# Patient Record
Sex: Male | Born: 1970 | Race: White | Hispanic: No | Marital: Married | Smoking: Current every day smoker
Health system: Southern US, Community
[De-identification: ages and names within clinical notes are randomized; demographics above are authoritative.]

## PROBLEM LIST (undated history)

## (undated) DIAGNOSIS — I10 Essential (primary) hypertension: Secondary | ICD-10-CM

## (undated) DIAGNOSIS — F988 Other specified behavioral and emotional disorders with onset usually occurring in childhood and adolescence: Secondary | ICD-10-CM

## (undated) HISTORY — DX: Other specified behavioral and emotional disorders with onset usually occurring in childhood and adolescence: F98.8

---

## 2019-11-28 DIAGNOSIS — Z0000 Encounter for general adult medical examination without abnormal findings: Secondary | ICD-10-CM | POA: Diagnosis not present

## 2019-11-28 DIAGNOSIS — G43109 Migraine with aura, not intractable, without status migrainosus: Secondary | ICD-10-CM | POA: Diagnosis not present

## 2019-11-28 DIAGNOSIS — I10 Essential (primary) hypertension: Secondary | ICD-10-CM | POA: Diagnosis not present

## 2019-11-28 DIAGNOSIS — F900 Attention-deficit hyperactivity disorder, predominantly inattentive type: Secondary | ICD-10-CM | POA: Diagnosis not present

## 2020-04-19 DIAGNOSIS — Z20822 Contact with and (suspected) exposure to covid-19: Secondary | ICD-10-CM | POA: Diagnosis not present

## 2020-05-31 DIAGNOSIS — I10 Essential (primary) hypertension: Secondary | ICD-10-CM | POA: Diagnosis not present

## 2020-05-31 DIAGNOSIS — F900 Attention-deficit hyperactivity disorder, predominantly inattentive type: Secondary | ICD-10-CM | POA: Diagnosis not present

## 2020-06-28 DIAGNOSIS — F900 Attention-deficit hyperactivity disorder, predominantly inattentive type: Secondary | ICD-10-CM | POA: Diagnosis not present

## 2020-06-28 DIAGNOSIS — I10 Essential (primary) hypertension: Secondary | ICD-10-CM | POA: Diagnosis not present

## 2020-07-07 DIAGNOSIS — I10 Essential (primary) hypertension: Secondary | ICD-10-CM | POA: Diagnosis not present

## 2020-10-31 DIAGNOSIS — Z20822 Contact with and (suspected) exposure to covid-19: Secondary | ICD-10-CM | POA: Diagnosis not present

## 2020-11-02 DIAGNOSIS — Z20822 Contact with and (suspected) exposure to covid-19: Secondary | ICD-10-CM | POA: Diagnosis not present

## 2020-11-12 DIAGNOSIS — R059 Cough, unspecified: Secondary | ICD-10-CM | POA: Diagnosis not present

## 2020-11-12 DIAGNOSIS — J019 Acute sinusitis, unspecified: Secondary | ICD-10-CM | POA: Diagnosis not present

## 2020-11-12 DIAGNOSIS — U071 COVID-19: Secondary | ICD-10-CM | POA: Diagnosis not present

## 2020-11-12 DIAGNOSIS — J40 Bronchitis, not specified as acute or chronic: Secondary | ICD-10-CM | POA: Diagnosis not present

## 2020-12-28 DIAGNOSIS — I10 Essential (primary) hypertension: Secondary | ICD-10-CM | POA: Diagnosis not present

## 2020-12-28 DIAGNOSIS — F900 Attention-deficit hyperactivity disorder, predominantly inattentive type: Secondary | ICD-10-CM | POA: Diagnosis not present

## 2021-02-09 DIAGNOSIS — I10 Essential (primary) hypertension: Secondary | ICD-10-CM | POA: Diagnosis not present

## 2021-02-09 DIAGNOSIS — Z9189 Other specified personal risk factors, not elsewhere classified: Secondary | ICD-10-CM | POA: Diagnosis not present

## 2021-02-09 DIAGNOSIS — Z720 Tobacco use: Secondary | ICD-10-CM | POA: Diagnosis not present

## 2021-02-16 ENCOUNTER — Emergency Department (HOSPITAL_COMMUNITY)
Admission: EM | Admit: 2021-02-16 | Discharge: 2021-02-17 | Disposition: A | Discharge status: Home / Self Care | Payer: BC Managed Care – PPO | Attending: Emergency Medicine | Admitting: Emergency Medicine

## 2021-02-16 ENCOUNTER — Encounter (HOSPITAL_COMMUNITY): Payer: Self-pay | Admitting: *Deleted

## 2021-02-16 ENCOUNTER — Emergency Department (HOSPITAL_COMMUNITY): Payer: BC Managed Care – PPO

## 2021-02-16 ENCOUNTER — Other Ambulatory Visit: Payer: Self-pay

## 2021-02-16 DIAGNOSIS — I10 Essential (primary) hypertension: Secondary | ICD-10-CM | POA: Insufficient documentation

## 2021-02-16 DIAGNOSIS — R0789 Other chest pain: Secondary | ICD-10-CM

## 2021-02-16 DIAGNOSIS — R079 Chest pain, unspecified: Secondary | ICD-10-CM | POA: Diagnosis not present

## 2021-02-16 DIAGNOSIS — Z79899 Other long term (current) drug therapy: Secondary | ICD-10-CM | POA: Diagnosis not present

## 2021-02-16 DIAGNOSIS — R0602 Shortness of breath: Secondary | ICD-10-CM | POA: Insufficient documentation

## 2021-02-16 DIAGNOSIS — F17210 Nicotine dependence, cigarettes, uncomplicated: Secondary | ICD-10-CM | POA: Insufficient documentation

## 2021-02-16 DIAGNOSIS — Z7982 Long term (current) use of aspirin: Secondary | ICD-10-CM | POA: Insufficient documentation

## 2021-02-16 HISTORY — DX: Essential (primary) hypertension: I10

## 2021-02-16 LAB — BASIC METABOLIC PANEL
Anion gap: 9 (ref 5–15)
BUN: 5 mg/dL — ABNORMAL LOW (ref 6–20)
CO2: 26 mmol/L (ref 22–32)
Calcium: 9 mg/dL (ref 8.9–10.3)
Chloride: 93 mmol/L — ABNORMAL LOW (ref 98–111)
Creatinine, Ser: 0.86 mg/dL (ref 0.61–1.24)
GFR, Estimated: >60 mL/min (ref >60)
Glucose, Bld: 94 mg/dL (ref 70–99)
Potassium: 3.6 mmol/L (ref 3.5–5.1)
Sodium: 128 mmol/L — ABNORMAL LOW (ref 135–145)

## 2021-02-16 LAB — CBC
HCT: 43.3 % (ref 39.0–52.0)
Hemoglobin: 15.2 g/dL (ref 13.0–17.0)
MCH: 32.1 pg (ref 26.0–34.0)
MCHC: 35.1 g/dL (ref 30.0–36.0)
MCV: 91.4 fL (ref 80.0–100.0)
Platelets: 246 10*3/uL (ref 150–400)
RBC: 4.74 MIL/uL (ref 4.22–5.81)
RDW: 12.2 % (ref 11.5–15.5)
WBC: 6.3 10*3/uL (ref 4.0–10.5)
nRBC: 0 % (ref 0.0–0.2)

## 2021-02-16 LAB — TROPONIN I (HIGH SENSITIVITY)
Troponin I (High Sensitivity): 15 ng/L (ref <18)
Troponin I (High Sensitivity): 13 ng/L (ref <18)

## 2021-02-16 LAB — BRAIN NATRIURETIC PEPTIDE: B Natriuretic Peptide: 50.1 pg/mL (ref 0.0–100.0)

## 2021-02-16 MED ORDER — NITROGLYCERIN 0.4 MG SL SUBL: 0.4000 mg | SUBLINGUAL_TABLET | SUBLINGUAL | 0 refills | Status: AC | PRN

## 2021-02-16 NOTE — ED Triage Notes (Signed)
Pt from home c./o hypertension, sob and R sided cp for a few days. Pt's bp readings 160s/100s. Woke up this morning with numbness and nausea. Reports compliance with bp meds.

## 2021-02-16 NOTE — ED Provider Notes (Signed)
[facility] EMERGENCY DEPARTMENT Provider Note   CSN: [id number] Arrival date & time: 02/16/21  1914     History Chief Complaint  Patient presents with   Hypertension   Chest Pain    Marvin Martinez is a 50 y.o. male.  HPI Patient is a 50 year old male with a history of hypertension who presents to the emergency department due to hypertension.  States that he began checking his blood pressure 2 weeks ago and noticed that it was fluctuating between the 160s to the 170s.  He smokes 1.5 packs/day and has been experiencing more shortness of breath per normal and also notes intermittent chest pressure.  States the pressure is along the central/left chest.  No modifying factors.  Also notes intermittent pain in the right arm that radiates into the right hand with tingling.  States that he followed up with his PCP regarding his blood pressure and his diltiazem was switched to extended release.  He is currently taking 50 mg of losartan daily as well as 120 mg of diltiazem XR daily.  Feels that his symptoms have not improved.  Earlier today he had 2-3 episodes of lightheadedness as well as an episode of nausea.  No syncope or vomiting.  He followed back up with his PCP and has an appointment tomorrow morning with cardiology.  Patient drinks about 3 alcoholic beverages per night.    Past Medical History:  Diagnosis Date   Hypertension     There are no problems to display for this patient.   History reviewed. No pertinent surgical history.     Family History  Problem Relation Age of Onset   COPD Mother    Congestive Heart Failure Mother    Hypertension Father     Social History   Tobacco Use   Smoking status: Every Day    Packs/day: 1.50    Years: 30.00    Pack years: 45.00    Types: Cigarettes   Smokeless tobacco: Never  Vaping Use   Vaping Use: Never used  Substance Use Topics   Alcohol use: Yes    Alcohol/week: 2.0 standard drinks    Types: 1 Glasses of  wine, 1 Cans of beer per week    Comment: daily   Drug use: Never    Home Medications Prior to Admission medications   Medication Sig Start Date End Date Taking? Authorizing Provider  nitroGLYCERIN (NITROSTAT) 0.4 MG SL tablet Place 1 tablet (0.4 mg total) under the tongue every 5 (five) minutes as needed for chest pain. 02/16/21  Yes Placido Sou, PA-C  amLODipine (NORVASC) 10 MG tablet Take 1 tablet (10 mg total) by mouth every evening. 02/17/21 05/18/21  Yates Decamp, MD  aspirin 81 MG chewable tablet Chew 81 mg by mouth daily.    [provider]  atorvastatin (LIPITOR) 10 MG tablet Take 1 tablet (10 mg total) by mouth daily. 02/17/21 05/18/21  Yates Decamp, MD  hydrochlorothiazide (MICROZIDE) 12.5 MG capsule Take 1 capsule (12.5 mg total) by mouth daily. 02/17/21 03/19/21  Yates Decamp, MD  losartan (COZAAR) 100 MG tablet Take 1 tablet (100 mg total) by mouth daily. 02/17/21   Yates Decamp, MD  nicotine polacrilex (NICORETTE) 4 MG gum Take 1 each (4 mg total) by mouth as needed for smoking cessation. 02/17/21   Yates Decamp, MD    Allergies    Patient has no known allergies.  Review of Systems   Review of Systems  All other systems reviewed and are negative.  Ten systems reviewed and are negative for acute change, except as noted in the HPI.   Physical Exam Updated Vital Signs BP (!) 156/91   Pulse 67   Temp 98 F (36.7 C) (Oral)   Resp (!) 23   Ht 5\' 8"  (1.727 m)   Wt 77.6 kg   SpO2 98%   BMI 26.00 kg/m   Physical Exam Vitals and nursing note reviewed.  Constitutional:      General: He is not in acute distress.    Appearance: Normal appearance. He is well-developed. He is not ill-appearing, toxic-appearing or diaphoretic.  HENT:     Head: Normocephalic and atraumatic.     Right Ear: External ear normal.     Left Ear: External ear normal.     Nose: Nose normal.     Mouth/Throat:     Mouth: Mucous membranes are moist.     Pharynx: Oropharynx is clear. No oropharyngeal  exudate or posterior oropharyngeal erythema.  Eyes:     Extraocular Movements: Extraocular movements intact.  Cardiovascular:     Rate and Rhythm: Normal rate and regular rhythm.     Pulses: Normal pulses.          Radial pulses are 2+ on the right side and 2+ on the left side.       Dorsalis pedis pulses are 2+ on the right side and 2+ on the left side.     Heart sounds: Normal heart sounds. Heart sounds not distant. No murmur heard. No systolic murmur is present.  No diastolic murmur is present.    No friction rub. No gallop. No S3 or S4 sounds.  Pulmonary:     Effort: Pulmonary effort is normal. No tachypnea, accessory muscle usage or respiratory distress.     Breath sounds: Normal breath sounds. No stridor. No decreased breath sounds, wheezing, rhonchi or rales.  Abdominal:     General: Abdomen is flat.     Tenderness: There is no abdominal tenderness.  Musculoskeletal:        General: Normal range of motion.     Cervical back: Normal range of motion and neck supple. No tenderness.     Right lower leg: No tenderness. No edema.     Left lower leg: No tenderness. No edema.  Skin:    General: Skin is warm and dry.  Neurological:     General: No focal deficit present.     Mental Status: He is alert and oriented to person, place, and time.  Psychiatric:        Mood and Affect: Mood normal.        Behavior: Behavior normal.   ED Results / Procedures / Treatments   Labs (all labs ordered are listed, but only abnormal results are displayed) Labs Reviewed  BASIC METABOLIC PANEL - Abnormal; Notable for the following components:      Result Value   Sodium 128 (*)    Chloride 93 (*)    BUN 5 (*)    All other components within normal limits  CBC  BRAIN NATRIURETIC PEPTIDE  TROPONIN I (HIGH SENSITIVITY)  TROPONIN I (HIGH SENSITIVITY)    EKG EKG Interpretation  Date/Time:  Wednesday February 16 2021 19:48:04 EDT Ventricular Rate:  71 PR Interval:  166 QRS Duration: 100 QT  Interval:  410 QTC Calculation: 445 R Axis:   67 Text Interpretation: Normal sinus rhythm Left ventricular hypertrophy with repolarization abnormality ( Sokolow-Lyon ) Abnormal ECG Motion artifact V3, no STEMI Confirmed by  Alvester Chou ([id number]) on 02/16/2021 8:19:46 PM  Radiology DG Chest 2 View  Result Date: 02/16/2021 CLINICAL DATA:  Chest pain short of breath EXAM: CHEST - 2 VIEW COMPARISON:  11/12/2020 FINDINGS: The heart size and mediastinal contours are within normal limits. Both lungs are clear. The visualized skeletal structures are unremarkable. IMPRESSION: No active cardiopulmonary disease. Electronically Signed   By: Marlan Palau M.D.   On: 02/16/2021 21:03    Procedures Procedures   Medications Ordered in ED Medications - No data to display  ED Course  I have reviewed the triage vital signs and the nursing notes.  Pertinent labs & imaging results that were available during my care of the patient were reviewed by me and considered in my medical decision making (see chart for details).  Clinical Course as of 02/17/21 1405  Wed Feb 16, 2021  2000 50 yo male [MT]  2254 Patient discussed with Dr. Jacinto Halim.  Given patient's troponin being 15 and his ECG showing no ischemia and also patient currently having no chest pain, recommends second troponin as well as a BNP.  If negative, patient can follow-up outpatient in his office tomorrow morning. [LJ]  2325 Sodium(!): 128 Pt notes drinking about 3 alcoholic beverages per night. Likely the source of his sodium levels. [LJ]    Clinical Course User Index [LJ] Placido Sou, PA-C [MT] Renaye Rakers Kermit Balo, MD   MDM Rules/Calculators/A&P                          Pt is a 50 y.o. male who presents to the ED d/t HTN, CP, SOB, lightheadedness, nausea.   Labs: CBC without abnormalities. BMP with a sodium of 128, chloride of 93 and a BUN of 5. Troponin of 15 with a repeat of 13. BNP of 50.1  Imaging: CXR is negative.  I, Placido Sou, PA-C, personally reviewed and evaluated these images and lab results as part of my medical decision-making.  Patient notes having an appointment tomorrow with Dr. Jacinto Halim with cardiology. Patient discussed with Dr. Jacinto Halim who recommends repeat troponin and BNP, and if reassuring, discharge home. Labs resulted and troponins appear flat. BNP WNL at 50.1. No ischemic changes noted on ECG. Doubt ACS at this time. Will discharge on a course of NTG for use as needed.   Patient also incidentally found to by hyponatremic at 128. He notes regular ETOH use and states he drinks about 3 ETOH beverages per night. 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   Feel that patient is stable for d/c at this time and he is agreeable. Discussed return precautions in length. Urged patient to make sure he follows up with cardiology tomorrow morning at his scheduled appointment. His questions were answered and he was amicable at the time of discharge.  Patient discussed with an evaluated by my attending physician Dr. Alvester Chou who is in agreement with the above plan.   Note: Portions of this report may have been transcribed using voice recognition software. Every effort was made to ensure accuracy; however, inadvertent computerized transcription errors may be present.   Final Clinical Impression(s) / ED Diagnoses Final diagnoses:  Chest pressure  Hypertension, unspecified type   Rx / DC Orders ED Discharge Orders          Ordered    nitroGLYCERIN (NITROSTAT) 0.4 MG SL tablet  Every 5 min PRN        02/16/21 2352  Placido Sou, PA-C 02/17/21 1411    Terald Sleeper, MD 02/17/21 1510

## 2021-02-16 NOTE — Discharge Instructions (Addendum)
I am prescribing nitroglycerin.  If you develop worsening chest tightness and shortness of breath please take this as prescribed and call [phone].  Please follow-up with Dr. Jacinto Halim at your appointment tomorrow morning.  Please come back to the emergency department with any new or worsening symptoms.

## 2021-02-17 ENCOUNTER — Encounter: Payer: Self-pay | Admitting: Cardiology

## 2021-02-17 ENCOUNTER — Ambulatory Visit: Payer: BC Managed Care – PPO | Admitting: Cardiology

## 2021-02-17 VITALS — BP 140/96 | HR 70 | Temp 97.6°F | Resp 16 | Ht 68.0 in | Wt 173.8 lb

## 2021-02-17 DIAGNOSIS — F17200 Nicotine dependence, unspecified, uncomplicated: Secondary | ICD-10-CM

## 2021-02-17 DIAGNOSIS — R0602 Shortness of breath: Secondary | ICD-10-CM | POA: Diagnosis not present

## 2021-02-17 DIAGNOSIS — F17210 Nicotine dependence, cigarettes, uncomplicated: Secondary | ICD-10-CM | POA: Diagnosis not present

## 2021-02-17 DIAGNOSIS — R072 Precordial pain: Secondary | ICD-10-CM

## 2021-02-17 DIAGNOSIS — I10 Essential (primary) hypertension: Secondary | ICD-10-CM

## 2021-02-17 MED ORDER — ATORVASTATIN CALCIUM 10 MG PO TABS: 10.0000 mg | ORAL_TABLET | Freq: Every day | ORAL | 2 refills | Status: DC

## 2021-02-17 MED ORDER — HYDROCHLOROTHIAZIDE 12.5 MG PO CAPS: 12.5000 mg | ORAL_CAPSULE | Freq: Every day | ORAL | 0 refills | Status: DC

## 2021-02-17 MED ORDER — NICOTINE POLACRILEX 4 MG MT GUM: 4.0000 mg | CHEWING_GUM | OROMUCOSAL | 0 refills | Status: DC | PRN

## 2021-02-17 MED ORDER — AMLODIPINE BESYLATE 10 MG PO TABS: 10.0000 mg | ORAL_TABLET | Freq: Every evening | ORAL | 2 refills | Status: DC

## 2021-02-17 NOTE — ED Notes (Signed)
Pt discharged and ambulated out of the ED without difficulty. 

## 2021-02-17 NOTE — ED Provider Notes (Signed)
50 yo male w/ hx of HTN, smoking (1.5 ppd) presenting to ED with chest pressure, fatigue.  Sx ongoing for 2-3 weeks but worsening today.  He reports intermittent chest pressures, which often occur at rest.  Sometimes associated with right arm heaviness.  Occasional nausea and lightheadedness.  BP has been higher than normal past 2 weeks.  Today he presented to ED because he feels very fatigued.  No active CP or discomfort at this time.  Wife is present at bedside.  Patient has office appointment with Dr Jacinto Halim, cardiologist, tomorrow morning (first appointment).  He denies personal or sig family hx of MI or cardiac disease.  Denies hx of diabetes or HLD.  Workup today showing NSR without evident ischemia on his ECG.  Delta troponins negative, flat.  BNP unremarkable.  Xray chest without focal infiltrates.  Pt reported having covid in July, 4 months ago, and does not want retesting.  The duration of his symptoms (2-3 weeks) seems less consistent with influenza.  No fever today.  Case was discussed with Dr Jacinto Halim, who advised patient could follow up with him in the office tomorrow.  Patient remained free of chest or arm discomfort in the ED, with normal blood tests.  I explained to him and his wife that I do have significant concerns for coronary disease with his presentation. They would prefer not to stay in the hospital tonight if they can be seen in the office tomorrow.  I think this is reasonable, given how close this follow up is.  HEART score of 4, moderate risk.  We can provide SL nitro and advised to use if chest discomfort returns; close return precautions provided.  They both understand that his negative workup in the ED today does NOT exclude significant coronary disease.   Terald Sleeper, MD 02/17/21 1014

## 2021-02-17 NOTE — Progress Notes (Signed)
Primary Physician/Referring:  Aliene BeamsHagler, Rachel, MD  Patient ID: Marvin Martinez, male    DOB: 08/25/1970, 50 y.o.   MRN: [id number]  Chief Complaint  Patient presents with   Hypertension   Shortness of Breath   Nicotine Dependence   New Patient (Initial Visit)    Referred by Aliene Beamsachel Hagler, MD   HPI:    Marvin Martinez  is a 50 y.o. Caucasian male patient with hypertension, referred to me for evaluation of dyspnea on exertion.  He has longstanding history of tobacco use disorder since age 50.  He was seen in the emergency room yesterday with chest pain and also left arm pain and associated with dyspnea on exertion.  He had abnormal EKG with ST depression and LVH but was ruled out for myocardial infarction and heart failure by serial enzymes and chemistry and chest x-ray, discharged home, he had an appointment to see me this morning.  Over the past 3 weeks he has noticed right shoulder and right arm discomfort after he tried to start an engine by pulling on the cord, since then he has been having chest tightness on and off that last a few minutes.  He also has left-sided chest tightness.  On further questioning he has been ongoing symptoms for several months when he climbs 1 flight of stairs with chest tightness.  Dyspnea on exertion has gradually worsened over time he is still smoking about 1.5 packs of cigarettes a day.  He also states that for the past 1 year or so, his blood pressure has been very much uncontrolled.  Past Medical History:  Diagnosis Date   Hypertension    History reviewed. No pertinent surgical history. Family History  Problem Relation Age of Onset   COPD Mother    Congestive Heart Failure Mother    Hypertension Father     Social History   Tobacco Use   Smoking status: Every Day    Packs/day: 1.50    Years: 30.00    Pack years: 45.00    Types: Cigarettes   Smokeless tobacco: Never  Substance Use Topics   Alcohol use: Yes    Alcohol/week: 2.0 standard drinks     Types: 1 Glasses of wine, 1 Cans of beer per week    Comment: daily   Marital Status: Married  ROS  Review of Systems  Cardiovascular:  Positive for chest pain and dyspnea on exertion. Negative for leg swelling.  Gastrointestinal:  Negative for melena.  Objective  Blood pressure (!) 140/96, pulse 70, temperature 97.6 F (36.4 C), temperature source Temporal, resp. rate 16, height 5\' 8"  (1.727 m), weight 173 lb 12.8 oz (78.8 kg), SpO2 100 %. Body mass index is 26.43 kg/m.  Vitals with BMI 02/17/2021 02/17/2021 02/16/2021  Height - 5\' 8"  -  Weight - 173 lbs 13 oz -  BMI - 26.43 -  Systolic 140 154 [id number]  Diastolic 96 96 91  Pulse 70 69 67     Physical Exam Neck:     Vascular: No carotid bruit or JVD.  Cardiovascular:     Rate and Rhythm: Normal rate and regular rhythm.     Pulses: Intact distal pulses.     Heart sounds: Normal heart sounds. No murmur heard.   No gallop.  Pulmonary:     Effort: Pulmonary effort is normal.     Breath sounds: Normal breath sounds.  Abdominal:     General: Bowel sounds are normal.     Palpations: Abdomen is  soft.  Musculoskeletal:        General: No swelling.     Laboratory examination:   Recent Labs    02/16/21 2000  NA 128*  K 3.6  CL 93*  CO2 26  GLUCOSE 94  BUN 5*  CREATININE 0.86  CALCIUM 9.0  GFRNONAA >60   estimated creatinine clearance is 100.5 mL/min (by C-G formula based on SCr of 0.86 mg/dL).  CMP Latest Ref Rng & Units 02/16/2021  Glucose 70 - 99 mg/dL 94  BUN 6 - 20 mg/dL 5(L)  Creatinine 0.61 - 1.24 mg/dL 0.86  Sodium 135 - 145 mmol/L 128(L)  Potassium 3.5 - 5.1 mmol/L 3.6  Chloride 98 - 111 mmol/L 93(L)  CO2 22 - 32 mmol/L 26  Calcium 8.9 - 10.3 mg/dL 9.0   CBC Latest Ref Rng & Units 02/16/2021  WBC 4.0 - 10.5 K/uL 6.3  Hemoglobin 13.0 - 17.0 g/dL 15.2  Hematocrit 39.0 - 52.0 % 43.3  Platelets 150 - 400 K/uL 246    Lipid Panel No results for input(s): CHOL, TRIG, LDLCALC, VLDL, HDL, CHOLHDL, LDLDIRECT in the  last 8760 hours. Lipid Panel  No results found for: CHOL, TRIG, HDL, CHOLHDL, VLDL, LDLCALC, LDLDIRECT, LABVLDL   HEMOGLOBIN A1C No results found for: HGBA1C, MPG TSH No results for input(s): TSH in the last 8760 hours.  Cardiac Panel (last 3 results) Recent Labs    02/16/21 2000 02/16/21 2158  TROPONINIHS 15 13    BNP (last 3 results) Recent Labs    02/16/21 2000  BNP 50.1    ProBNP (last 3 results) No results for input(s): PROBNP in the last 8760 hours.  External labs:   Labs 07/07/2020:  Total cholesterol 174, triglycerides 44, HDL 78, LDL 86.  Non-HDL cholesterol 96.  Medications and allergies  No Known Allergies   Medication prior to this encounter:   Outpatient Medications Prior to Visit  Medication Sig Dispense Refill   aspirin 81 MG chewable tablet Chew 81 mg by mouth daily.     losartan (COZAAR) 100 MG tablet Take 1 tablet (100 mg total) by mouth daily. 60 tablet 0   nitroGLYCERIN (NITROSTAT) 0.4 MG SL tablet Place 1 tablet (0.4 mg total) under the tongue every 5 (five) minutes as needed for chest pain. 12 tablet 0   diltiazem (CARDIZEM) 120 MG tablet Take 120 mg by mouth 2 (two) times daily.     losartan (COZAAR) 50 MG tablet Take 50 mg by mouth daily.     No facility-administered medications prior to visit.     Medication list after today's encounter   Current Outpatient Medications  Medication Instructions   amLODipine (NORVASC) 10 mg, Oral, Every evening   aspirin 81 mg, Oral, Daily   atorvastatin (LIPITOR) 10 mg, Oral, Daily   hydrochlorothiazide (MICROZIDE) 12.5 mg, Oral, Daily   losartan (COZAAR) 100 mg, Oral, Daily   nicotine polacrilex (NICORETTE) 4 mg, Oral, As needed   nitroGLYCERIN (NITROSTAT) 0.4 mg, Sublingual, Every 5 min PRN    Radiology:   DG Chest 2 View  Result Date: 02/16/2021 CLINICAL DATA:  Chest pain short of breath EXAM: CHEST - 2 VIEW COMPARISON:  11/12/2020 FINDINGS: The heart size and mediastinal contours are within  normal limits. Both lungs are clear. The visualized skeletal structures are unremarkable. IMPRESSION: No active cardiopulmonary disease. Electronically Signed   By: Franchot Gallo M.D.   On: 02/16/2021 21:03   .  Compared to 12/04/2020, mild cardiomegaly not present.  Cardiac Studies:  NA  EKG:   EKG 02/17/2021: Normal sinus rhythm at rate of 63 bpm, normal axis, nonspecific ST depression in the lateral leads, LVH with repolarization abnormality.  No change from yesterday's EKG on 02/16/2021.    Assessment     ICD-10-CM   1. Primary hypertension  I10 EKG 12-Lead    losartan (COZAAR) 100 MG tablet    hydrochlorothiazide (MICROZIDE) 12.5 MG capsule    amLODipine (NORVASC) 10 MG tablet    Basic metabolic panel    2. SOB (shortness of breath)  R06.02     3. Precordial pain  R07.2 PCV MYOCARDIAL PERFUSION WO LEXISCAN    PCV ECHOCARDIOGRAM COMPLETE    CT CARDIAC SCORING ([facility] LOCATIONS ONLY)    amLODipine (NORVASC) 10 MG tablet    atorvastatin (LIPITOR) 10 MG tablet    4. Tobacco use disorder, severe, dependence  F17.200 nicotine polacrilex (NICORETTE) 4 MG gum       Medications Discontinued During This Encounter  Medication Reason   losartan (COZAAR) 50 MG tablet Dose change   diltiazem (CARDIZEM) 120 MG tablet Change in therapy    Meds ordered this encounter  Medications   hydrochlorothiazide (MICROZIDE) 12.5 MG capsule    Sig: Take 1 capsule (12.5 mg total) by mouth daily.    Dispense:  30 capsule    Refill:  0   amLODipine (NORVASC) 10 MG tablet    Sig: Take 1 tablet (10 mg total) by mouth every evening.    Dispense:  30 tablet    Refill:  2    Discontinue diltiazem   nicotine polacrilex (NICORETTE) 4 MG gum    Sig: Take 1 each (4 mg total) by mouth as needed for smoking cessation.    Dispense:  100 tablet    Refill:  0   atorvastatin (LIPITOR) 10 MG tablet    Sig: Take 1 tablet (10 mg total) by mouth daily.    Dispense:  30 tablet    Refill:  2    Orders Placed  This Encounter  Procedures   CT CARDIAC SCORING ([facility] LOCATIONS ONLY)    Standing Status:   Future    Standing Expiration Date:   04/19/2021    Order Specific Question:   Preferred imaging location?    Answer:   [facility]   Basic metabolic panel    Standing Status:   Future    Standing Expiration Date:   02/17/2022   PCV MYOCARDIAL PERFUSION WO LEXISCAN    Standing Status:   Future    Standing Expiration Date:   04/19/2021   EKG 12-Lead   PCV ECHOCARDIOGRAM COMPLETE    Standing Status:   Future    Standing Expiration Date:   02/17/2022   Recommendations:   Marvin Martinez is a 50 y.o. Caucasian male patient with hypertension, referred to me for evaluation of dyspnea on exertion.  He has longstanding history of tobacco use disorder since age 82.  He was seen in the emergency room yesterday with chest pain and also left arm pain and associated with dyspnea on exertion.  He had abnormal EKG with ST depression and LVH but was ruled out for myocardial infarction and heart failure by serial enzymes and chemistry and chest x-ray, discharged home, he had an appointment to see me this morning.  Over the past 3 weeks he has noticed right shoulder and right arm discomfort after he tried to start an engine by pulling on the cord, since then he has been having chest  tightness on and off that last a few minutes.  He also has left-sided chest tightness.  On further questioning he has been ongoing symptoms for several months when he climbs 1 flight of stairs with chest tightness.  Dyspnea on exertion has gradually worsened over time he is still smoking about 1.5 packs of cigarettes a day.  Story is concerning for angina pectoris.  His blood pressure still remains elevated.  I will discontinue diltiazem as his heart rate is relatively low and I plan to perform stress test, will change him to amlodipine 10 mg daily.  We will also increase his losartan 100 mg daily, I will add HCTZ to this.  Eventually if blood pressure is  controlled, I will combine the medications.  He does have nitroglycerin and we discussed regarding signs and symptoms of angina pectoris. Schedule for a Exercise Nuclear stress test to evaluate for myocardial ischemia. Will schedule for an echocardiogram.  I will also schedule him for a coronary calcium score in view of >40-pack-year history of smoking.  Tobacco use cessation discussed, counseling performed for additional 6 to 7 minutes, I prescribed him Nicorette gum.  I will go ahead and start him on atorvastatin 10 mg daily for now for cardiovascular protection.  He is presently on aspirin 81 mg, continue the same.  Office visit following the work-up/investigations in 4 weeks or sooner if he still continues to have persistent symptoms.     Adrian Prows, MD, [facility] 02/17/2021, 11:32 AM Office: [phone]

## 2021-02-25 ENCOUNTER — Telehealth: Payer: Self-pay

## 2021-02-25 NOTE — Telephone Encounter (Signed)
Patient called about right arm pain and wants to know of it will affect him doing the stress and echocardiogram tests on Monday. Patient also complains of fatigue and weakness. Patient does not have MyChart would like call back.

## 2021-02-25 NOTE — Telephone Encounter (Signed)
Arm pain should not affect him from the stress test, it is not a very intense stress test.  We only use a certain protocol.  Let him get all the test done so we can figure out what exactly is happening.

## 2021-02-25 NOTE — Telephone Encounter (Signed)
Patient is aware, he will be here at scheduled time.

## 2021-02-28 ENCOUNTER — Ambulatory Visit: Payer: BC Managed Care – PPO

## 2021-02-28 ENCOUNTER — Other Ambulatory Visit: Payer: Self-pay

## 2021-02-28 DIAGNOSIS — R072 Precordial pain: Secondary | ICD-10-CM | POA: Diagnosis not present

## 2021-02-28 NOTE — Progress Notes (Signed)
Mild LVH, aortic root enlargement, normal LVEF.  We will discuss on office visit.

## 2021-03-03 ENCOUNTER — Other Ambulatory Visit: Payer: Self-pay | Admitting: Cardiology

## 2021-03-03 DIAGNOSIS — I10 Essential (primary) hypertension: Secondary | ICD-10-CM | POA: Diagnosis not present

## 2021-03-04 LAB — BASIC METABOLIC PANEL
BUN/Creatinine Ratio: 7 — ABNORMAL LOW (ref 9–20)
BUN: 6 mg/dL (ref 6–24)
CO2: 26 mmol/L (ref 20–29)
Calcium: 9.5 mg/dL (ref 8.7–10.2)
Chloride: 91 mmol/L — ABNORMAL LOW (ref 96–106)
Creatinine, Ser: 0.85 mg/dL (ref 0.76–1.27)
Glucose: 85 mg/dL (ref 70–99)
Potassium: 4.3 mmol/L (ref 3.5–5.2)
Sodium: 132 mmol/L — ABNORMAL LOW (ref 134–144)
eGFR: 107 mL/min/{1.73_m2} (ref >59)

## 2021-03-06 NOTE — Progress Notes (Signed)
Stable BMP and sodium level has improved

## 2021-03-08 ENCOUNTER — Ambulatory Visit
Admission: RE | Admit: 2021-03-08 | Discharge: 2021-03-08 | Disposition: A | Discharge status: Home / Self Care | Payer: No Typology Code available for payment source | Source: Ambulatory Visit | Attending: Cardiology | Admitting: Cardiology

## 2021-03-08 DIAGNOSIS — R072 Precordial pain: Secondary | ICD-10-CM

## 2021-03-08 NOTE — Progress Notes (Signed)
Coronary calcium score 03/08/2021: LM 0 LAD 39.5 LCx 0 RCA 3.4 Total Agatston score 42.9, MESA database percentile 83. Aneurysmal dilatation of the ascending aorta at 41 mm and descending aorta is normal at 27.  There is mild calcification at the level of the aortic valve.  Recommend correlation with echocardiography.

## 2021-03-09 ENCOUNTER — Other Ambulatory Visit: Payer: Self-pay

## 2021-03-09 ENCOUNTER — Ambulatory Visit: Payer: BC Managed Care – PPO

## 2021-03-09 DIAGNOSIS — R072 Precordial pain: Secondary | ICD-10-CM

## 2021-03-11 ENCOUNTER — Other Ambulatory Visit: Payer: Self-pay | Admitting: Cardiology

## 2021-03-11 DIAGNOSIS — I10 Essential (primary) hypertension: Secondary | ICD-10-CM

## 2021-03-11 DIAGNOSIS — R072 Precordial pain: Secondary | ICD-10-CM

## 2021-03-16 ENCOUNTER — Other Ambulatory Visit: Payer: BC Managed Care – PPO

## 2021-03-16 ENCOUNTER — Other Ambulatory Visit: Payer: Self-pay

## 2021-03-16 DIAGNOSIS — R072 Precordial pain: Secondary | ICD-10-CM | POA: Diagnosis not present

## 2021-03-17 DIAGNOSIS — R0789 Other chest pain: Secondary | ICD-10-CM | POA: Diagnosis not present

## 2021-03-17 DIAGNOSIS — I10 Essential (primary) hypertension: Secondary | ICD-10-CM | POA: Diagnosis not present

## 2021-03-17 DIAGNOSIS — F900 Attention-deficit hyperactivity disorder, predominantly inattentive type: Secondary | ICD-10-CM | POA: Diagnosis not present

## 2021-03-17 DIAGNOSIS — J439 Emphysema, unspecified: Secondary | ICD-10-CM | POA: Diagnosis not present

## 2021-03-23 ENCOUNTER — Encounter: Payer: Self-pay | Admitting: Cardiology

## 2021-03-23 ENCOUNTER — Ambulatory Visit: Payer: BC Managed Care – PPO | Admitting: Cardiology

## 2021-03-23 ENCOUNTER — Other Ambulatory Visit: Payer: Self-pay

## 2021-03-23 VITALS — BP 167/98 | HR 96 | Temp 97.7°F | Resp 17 | Ht 68.0 in | Wt 175.6 lb

## 2021-03-23 DIAGNOSIS — I7121 Aneurysm of the ascending aorta, without rupture: Secondary | ICD-10-CM | POA: Diagnosis not present

## 2021-03-23 DIAGNOSIS — R072 Precordial pain: Secondary | ICD-10-CM

## 2021-03-23 DIAGNOSIS — E7800 Pure hypercholesterolemia, unspecified: Secondary | ICD-10-CM | POA: Diagnosis not present

## 2021-03-23 DIAGNOSIS — I10 Essential (primary) hypertension: Secondary | ICD-10-CM | POA: Diagnosis not present

## 2021-03-23 DIAGNOSIS — R931 Abnormal findings on diagnostic imaging of heart and coronary circulation: Secondary | ICD-10-CM

## 2021-03-23 MED ORDER — METOPROLOL SUCCINATE ER 50 MG PO TB24: 50.0000 mg | ORAL_TABLET | Freq: Every day | ORAL | 1 refills | Status: DC

## 2021-03-23 MED ORDER — OLMESARTAN MEDOXOMIL-HCTZ 40-25 MG PO TABS: 1.0000 | ORAL_TABLET | ORAL | 1 refills | Status: DC

## 2021-03-23 MED ORDER — ATORVASTATIN CALCIUM 20 MG PO TABS: 20.0000 mg | ORAL_TABLET | Freq: Every day | ORAL | 1 refills | Status: DC

## 2021-03-23 NOTE — Progress Notes (Signed)
Primary Physician/Referring:  Caren Macadam, MD  Patient ID: Marvin Martinez, male    DOB: May 03, 1970, 50 y.o.   MRN: [id number]  Chief Complaint  Patient presents with   Hypertension   Shortness of Breath   Chest Pain    4 weeks   HPI:    Marvin Martinez  is a 50 y.o. Caucasian male patient with hypertension,  longstanding history of tobacco use disorder since age 55,  abnormal EKG with ST depression and LVH, who had seen about 6 weeks ago for exertional chest pain suggestive of angina pectoris now presents for follow-up.  I seen him 6 weeks ago, he still has chest discomfort and also dyspnea on exertion but states that since last office visit his symptoms of slightly improved.  He has not had any rest pain.  He is accompanied by his wife at the bedside.  Dyspnea has remained stable.  No PND or orthopnea.   Past Medical History:  Diagnosis Date   ADD (attention deficit disorder)    Hypertension    History reviewed. No pertinent surgical history. Family History  Problem Relation Age of Onset   COPD Mother    Congestive Heart Failure Mother    Hyperlipidemia Father    Hypertension Father    Hyperlipidemia Sister    Hypertension Sister     Social History   Tobacco Use   Smoking status: Every Day    Packs/day: 0.50    Years: 30.00    Pack years: 15.00    Types: Cigarettes   Smokeless tobacco: Never  Substance Use Topics   Alcohol use: Yes    Alcohol/week: 2.0 standard drinks    Types: 1 Glasses of wine, 1 Cans of beer per week    Comment: daily   Marital Status: Married  ROS  Review of Systems  Cardiovascular:  Positive for chest pain and dyspnea on exertion. Negative for leg swelling.  Gastrointestinal:  Negative for melena.  Objective  Blood pressure (!) 167/98, pulse 96, temperature 97.7 F (36.5 C), temperature source Temporal, resp. rate 17, height 5\' 8"  (1.727 m), weight 175 lb 9.6 oz (79.7 kg), SpO2 100 %. Body mass index is 26.7 kg/m.  Vitals with BMI  03/23/2021 02/17/2021 02/17/2021  Height 5\' 8"  - 5\' 8"   Weight 175 lbs 10 oz - 173 lbs 13 oz  BMI [id number] - [id number]  Systolic [id number]  Diastolic 98 96 96  Pulse 96 70 69     Physical Exam Neck:     Vascular: No carotid bruit or JVD.  Cardiovascular:     Rate and Rhythm: Normal rate and regular rhythm.     Pulses: Intact distal pulses.     Heart sounds: Normal heart sounds. No murmur heard.   No gallop.  Pulmonary:     Effort: Pulmonary effort is normal.     Breath sounds: Normal breath sounds.  Abdominal:     General: Bowel sounds are normal.     Palpations: Abdomen is soft.  Musculoskeletal:        General: No swelling.     Laboratory examination:   Recent Labs    02/16/21 2000 03/03/21 1255  NA 128* 132*  K 3.6 4.3  CL 93* 91*  CO2 26 26  GLUCOSE 94 85  BUN 5* 6  CREATININE 0.86 0.85  CALCIUM 9.0 9.5  GFRNONAA >60  --    estimated creatinine clearance is 101.7 mL/min (by C-G formula based on SCr of  0.85 mg/dL).  CMP Latest Ref Rng & Units 03/03/2021 02/16/2021  Glucose 70 - 99 mg/dL 85 94  BUN 6 - 24 mg/dL 6 5(L)  Creatinine 0.76 - 1.27 mg/dL 0.85 0.86  Sodium 134 - 144 mmol/L 132(L) 128(L)  Potassium 3.5 - 5.2 mmol/L 4.3 3.6  Chloride 96 - 106 mmol/L 91(L) 93(L)  CO2 20 - 29 mmol/L 26 26  Calcium 8.7 - 10.2 mg/dL 9.5 9.0   CBC Latest Ref Rng & Units 02/16/2021  WBC 4.0 - 10.5 K/uL 6.3  Hemoglobin 13.0 - 17.0 g/dL 15.2  Hematocrit 39.0 - 52.0 % 43.3  Platelets 150 - 400 K/uL 246    Lipid Panel No results for input(s): CHOL, TRIG, LDLCALC, VLDL, HDL, CHOLHDL, LDLDIRECT in the last 8760 hours. Lipid Panel  No results found for: CHOL, TRIG, HDL, CHOLHDL, VLDL, LDLCALC, LDLDIRECT, LABVLDL   HEMOGLOBIN A1C No results found for: HGBA1C, MPG TSH No results for input(s): TSH in the last 8760 hours.  Cardiac Panel (last 3 results) No results for input(s): CKTOTAL, CKMB, TROPONINIHS, RELINDX in the last 72 hours.   BNP (last 3 results) Recent Labs     02/16/21 2000  BNP 50.1    ProBNP (last 3 results) No results for input(s): PROBNP in the last 8760 hours.  External labs:   Lipid Panel w/reflex   2020-07-07    Cholesterol 174   <200  CHOL/HDL 2.2   2.0-4.0  HDLD 78   30-70  Triglyceride 44   0-199  NHDL 96   0-129  LDL Chol Calc (NIH) 86   0-99    Medications and allergies   Allergies  Allergen Reactions   Valsartan     Other reaction(s): headaches     Medication prior to this encounter:   Outpatient Medications Prior to Visit  Medication Sig Dispense Refill   amLODipine (NORVASC) 10 MG tablet TAKE 1 TABLET BY MOUTH EVERY DAY IN THE EVENING 30 tablet 2   amphetamine-dextroamphetamine (ADDERALL XR) 30 MG 24 hr capsule Take 1 capsule by mouth daily.     aspirin 81 MG chewable tablet Chew 81 mg by mouth daily.     nicotine polacrilex (NICORETTE) 4 MG gum Take 1 each (4 mg total) by mouth as needed for smoking cessation. 100 tablet 0   nitroGLYCERIN (NITROSTAT) 0.4 MG SL tablet Place 1 tablet (0.4 mg total) under the tongue every 5 (five) minutes as needed for chest pain. 12 tablet 0   atorvastatin (LIPITOR) 10 MG tablet TAKE 1 TABLET BY MOUTH EVERY DAY 30 tablet 2   hydrochlorothiazide (MICROZIDE) 12.5 MG capsule Take 12.5 mg by mouth daily.     losartan (COZAAR) 100 MG tablet Take 1 tablet (100 mg total) by mouth daily. 60 tablet 0   losartan (COZAAR) 50 MG tablet Take 1 tablet by mouth daily.     atorvastatin (LIPITOR) 10 MG tablet Take 1 tablet by mouth daily.     hydrochlorothiazide (MICROZIDE) 12.5 MG capsule TAKE 1 CAPSULE BY MOUTH EVERY DAY 30 capsule 0   No facility-administered medications prior to visit.     Medication list after today's encounter   Current Outpatient Medications  Medication Instructions   amLODipine (NORVASC) 10 MG tablet TAKE 1 TABLET BY MOUTH EVERY DAY IN THE EVENING   amphetamine-dextroamphetamine (ADDERALL XR) 30 MG 24 hr capsule 1 capsule, Oral, Daily   aspirin 81 mg, Oral, Daily    atorvastatin (LIPITOR) 20 mg, Oral, Daily   metoprolol succinate (TOPROL-XL) 50 mg,  Oral, Daily, Take with or immediately following a meal.   nicotine polacrilex (NICORETTE) 4 mg, Oral, As needed   nitroGLYCERIN (NITROSTAT) 0.4 mg, Sublingual, Every 5 min PRN   olmesartan-hydrochlorothiazide (BENICAR HCT) 40-25 MG tablet 1 tablet, Oral, BH-each morning    Radiology:   No results found. .  Compared to 12/04/2020, mild cardiomegaly not present.  Cardiac Studies:   PCV ECHOCARDIOGRAM COMPLETE 123XX123 Normal LV systolic function with EF 57%. Left ventricle cavity is normal in size. Mild concentric remodeling of the left ventricle. Normal global wall motion. Normal diastolic filling pattern. Calculated EF 57%. Trileaflet aortic valve. No evidence of aortic stenosis. Mild (Grade I) aortic regurgitation. Mild aortic valve leaflet calcification mostly involving non coronary cusp. 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   PCV MYOCARDIAL PERFUSION WO LEXISCAN 03/09/2021 Exercise nuclear stress test was performed using Bruce protocol. Patient reached 10.1 METS, and 85% of age predicted maximum heart rate. Exercise capacity was good. No chest pain reported. Heart rate and hemodynamic response were normal. Stress EKG revealed no ischemic changes. Normal myocardial perfusion. Stress LVEF 50%. Low risk study.  Coronary calcium score 03/08/2021: LM 0 LAD 39.5 LCx 0 RCA 3.4 Total Agatston score 42.9, MESA database percentile 83. Aneurysmal dilatation of the ascending aorta at 41 mm and descending aorta is normal at 27.  There is mild calcification at the level of the aortic valve.  Recommend correlation with echocardiography.  EKG:   02/17/2021: Normal sinus rhythm at rate of 63 bpm, normal axis, nonspecific ST depression in the lateral leads, LVH with repolarization abnormality.  No change from yesterday's EKG on 02/16/2021.    Assessment     ICD-10-CM   1. Precordial pain  R07.2     2. Primary  hypertension  I10 metoprolol succinate (TOPROL-XL) 50 MG 24 hr tablet    3. Aneurysm of ascending aorta without rupture  I71.21     4. Pure hypercholesterolemia  E78.00 atorvastatin (LIPITOR) 20 MG tablet    Lipid Panel With LDL/HDL Ratio    5. Elevated coronary artery calcium score: Total Agatston score 42.9, MESA database percentile 83.  R93.1 atorvastatin (LIPITOR) 20 MG tablet       Medications Discontinued During This Encounter  Medication Reason   atorvastatin (LIPITOR) 10 MG tablet Duplicate   hydrochlorothiazide (MICROZIDE) 12.5 MG capsule    hydrochlorothiazide (MICROZIDE) 12.5 MG capsule Change in therapy   losartan (COZAAR) 100 MG tablet Change in therapy   losartan (COZAAR) 50 MG tablet Change in therapy   atorvastatin (LIPITOR) 10 MG tablet Reorder    Meds ordered this encounter  Medications   metoprolol succinate (TOPROL-XL) 50 MG 24 hr tablet    Sig: Take 1 tablet (50 mg total) by mouth daily. Take with or immediately following a meal.    Dispense:  90 tablet    Refill:  1   olmesartan-hydrochlorothiazide (BENICAR HCT) 40-25 MG tablet    Sig: Take 1 tablet by mouth every morning.    Dispense:  90 tablet    Refill:  1   atorvastatin (LIPITOR) 20 MG tablet    Sig: Take 1 tablet (20 mg total) by mouth daily.    Dispense:  90 tablet    Refill:  1     Orders Placed This Encounter  Procedures   Lipid Panel With LDL/HDL Ratio    Recommendations:   ANDRIA DIPERNA is a 50 y.o. Caucasian male patient with hypertension,  longstanding history of tobacco use disorder since  age 72,  abnormal EKG with ST depression and LVH, who had seen about 6 weeks ago for exertional chest pain suggestive of angina pectoris now presents for follow-up.  His symptoms have improved however he still has exertional chest tightness and also dyspnea on exertion.  He is also reduced his smoking.  Physical examination does not reveal any clinical evidence of heart failure.  I reviewed the  results of the echocardiogram, he has aortic root dilatation and also CT scan of the chest also reveals and correlates well with echocardiographic findings.  He will need annual surveillance echocardiogram for the same.  With regard to stress test, he had excellent exercise tolerance.  No evidence of ischemia.  However his coronary calcium score is at the greater than 88 percentile, will need to be extremely aggressive with risk factor modification especially with control of hypertension, smoking cessation and also will try to get his LDL goal to closer to 55 as he is only 50 years of age.  Blood pressure is still not well controlled, will discontinue losartan and also HCTZ and switch him to Benicar HCT 40/25 mg in the morning.  I will also add metoprolol succinate 50 mg daily.  He is already on amlodipine and if blood pressure does not get controlled, he may need renal artery duplex.  I would like to see him back in 4 to 6 weeks for follow-up.  We will repeat lipid profile testing prior to his next office visit.  With regard to exertional chest pain, it is probably related to uncontrolled hypertension, however angina pectoris still needs to be kept in mind, he does have nitroglycerin and knows how to use it.  His wife is present at the bedside.  40-minute office visit encounter.  Greater than 50% of time was spent with discussions regarding the complexity of his medical issues.   Yates Decamp, MD, [facility] 03/23/2021, 12:08 PM Office: [phone]

## 2021-04-07 ENCOUNTER — Other Ambulatory Visit: Payer: Self-pay | Admitting: Cardiology

## 2021-04-07 DIAGNOSIS — I10 Essential (primary) hypertension: Secondary | ICD-10-CM

## 2021-04-27 DIAGNOSIS — E7800 Pure hypercholesterolemia, unspecified: Secondary | ICD-10-CM | POA: Diagnosis not present

## 2021-04-28 LAB — LIPID PANEL WITH LDL/HDL RATIO
Cholesterol, Total: 132 mg/dL (ref 100–199)
HDL: 73 mg/dL (ref >39)
LDL Chol Calc (NIH): 48 mg/dL (ref 0–99)
LDL/HDL Ratio: 0.7 ratio (ref 0.0–3.6)
Triglycerides: 44 mg/dL (ref 0–149)
VLDL Cholesterol Cal: 11 mg/dL (ref 5–40)

## 2021-04-28 NOTE — Progress Notes (Signed)
Good lipids seeing you soon

## 2021-05-03 NOTE — Progress Notes (Signed)
Primary Physician/Referring:  Caren Macadam, MD  Patient ID: Marvin Martinez, male    DOB: 12/03/1970, 51 y.o.   MRN: [id number]  Chief Complaint  Patient presents with   Hypertension   Hyperlipidemia   Follow-up   HPI:    Marvin Martinez  is a 51 y.o. Caucasian male patient with hypertension,  longstanding history of tobacco use disorder since age 64,  abnormal EKG with ST depression and LVH, who underwent echocardiogram and stress test.  Stress test was overall low risk and echocardiogram revealed preserved LVEF with aortic root dilation.    Patient presents for 6 week follow up of hypertension and hyperlipidemia. At last office visit switch patient from HCTZ to Benicar/HCTZ and added Toprol-XL 50 mg daily.  Repeat BMP remained stable.Lipid profile testing shows lipids are now well controlled.  Patient is tolerating medication changes without issue.  He has had no recurrence of chest pain, has not taken nitroglycerin.  His dyspnea remains stable.  Denies palpitations, syncope, near syncope, dizziness, lightheadedness, orthopnea, PND, leg edema.  Patient is monitoring his blood pressure at home with readings averaging 120-125/80s mmHg.  He is also working on quitting smoking, presently only smoking a few cigarettes per day with the goal of completely quitting by the end of this month.  He is using nicotine replacement gum to assist him and request refill of this.  Past Medical History:  Diagnosis Date   ADD (attention deficit disorder)    Hypertension    No past surgical history on file. Family History  Problem Relation Age of Onset   COPD Mother    Congestive Heart Failure Mother    Hyperlipidemia Father    Hypertension Father    Hyperlipidemia Sister    Hypertension Sister     Social History   Tobacco Use   Smoking status: Every Day    Packs/day: 0.50    Years: 30.00    Pack years: 15.00    Types: Cigarettes   Smokeless tobacco: Never  Substance Use Topics   Alcohol use:  Yes    Alcohol/week: 2.0 standard drinks    Types: 1 Glasses of wine, 1 Cans of beer per week    Comment: daily   Marital Status: Married  ROS  Review of Systems  Constitutional: Negative for weight gain.  Cardiovascular:  Positive for chest pain (improved) and dyspnea on exertion (stable). Negative for claudication, leg swelling, near-syncope, orthopnea, palpitations, paroxysmal nocturnal dyspnea and syncope.  Gastrointestinal:  Negative for melena.  Neurological:  Negative for dizziness.  Objective  Blood pressure 121/82, pulse 69, temperature 98.6 F (37 C), temperature source Temporal, height 5\' 8"  (1.727 m), weight 176 lb 12.8 oz (80.2 kg), SpO2 99 %. Body mass index is 26.88 kg/m.  Vitals with BMI 05/04/2021 03/23/2021 02/17/2021  Height 5\' 8"  5\' 8"  -  Weight 176 lbs 13 oz 175 lbs 10 oz -  BMI XX123456 XX123456 -  Systolic 123XX123 [id number] XX123456  Diastolic 82 98 96  Pulse 69 96 70     Physical Exam Neck:     Vascular: No carotid bruit or JVD.  Cardiovascular:     Rate and Rhythm: Normal rate and regular rhythm.     Pulses: Intact distal pulses.     Heart sounds: Normal heart sounds. No murmur heard.   No gallop.  Pulmonary:     Effort: Pulmonary effort is normal.     Breath sounds: Normal breath sounds.  Abdominal:     General:  Bowel sounds are normal.     Palpations: Abdomen is soft.  Musculoskeletal:        General: No swelling.  Physical exam unchanged compared to previous office visit.  Laboratory examination:   Recent Labs    02/16/21 2000 03/03/21 1255  NA 128* 132*  K 3.6 4.3  CL 93* 91*  CO2 26 26  GLUCOSE 94 85  BUN 5* 6  CREATININE 0.86 0.85  CALCIUM 9.0 9.5  GFRNONAA >60  --    CrCl cannot be calculated (Patient's most recent lab result is older than the maximum 21 days allowed.).  CMP Latest Ref Rng & Units 03/03/2021 02/16/2021  Glucose 70 - 99 mg/dL 85 94  BUN 6 - 24 mg/dL 6 5(L)  Creatinine 0.76 - 1.27 mg/dL 0.85 0.86  Sodium 134 - 144 mmol/L 132(L)  128(L)  Potassium 3.5 - 5.2 mmol/L 4.3 3.6  Chloride 96 - 106 mmol/L 91(L) 93(L)  CO2 20 - 29 mmol/L 26 26  Calcium 8.7 - 10.2 mg/dL 9.5 9.0   CBC Latest Ref Rng & Units 02/16/2021  WBC 4.0 - 10.5 K/uL 6.3  Hemoglobin 13.0 - 17.0 g/dL 15.2  Hematocrit 39.0 - 52.0 % 43.3  Platelets 150 - 400 K/uL 246    Lipid Panel Recent Labs    04/27/21 0833  CHOL 132  TRIG 44  LDLCALC 48  HDL 73   Lipid Panel     Component Value Date/Time   CHOL 132 04/27/2021 0833   TRIG 44 04/27/2021 0833   HDL 73 04/27/2021 0833   LDLCALC 48 04/27/2021 0833   LABVLDL 11 04/27/2021 0833     HEMOGLOBIN A1C No results found for: HGBA1C, MPG TSH No results for input(s): TSH in the last 8760 hours.  Cardiac Panel (last 3 results) No results for input(s): CKTOTAL, CKMB, TROPONINIHS, RELINDX in the last 72 hours.   BNP (last 3 results) Recent Labs    02/16/21 2000  BNP 50.1    ProBNP (last 3 results) No results for input(s): PROBNP in the last 8760 hours.  External labs:   Lipid Panel w/reflex   2020-07-07    Cholesterol 174   <200  CHOL/HDL 2.2   2.0-4.0  HDLD 78   30-70  Triglyceride 44   0-199  NHDL 96   0-129  LDL Chol Calc (NIH) 86   0-99    Medications and allergies   Allergies  Allergen Reactions   Valsartan Other (See Comments)     headaches     Medication prior to this encounter:   Outpatient Medications Prior to Visit  Medication Sig Dispense Refill   amLODipine (NORVASC) 10 MG tablet TAKE 1 TABLET BY MOUTH EVERY DAY IN THE EVENING 30 tablet 2   amphetamine-dextroamphetamine (ADDERALL XR) 30 MG 24 hr capsule Take 1 capsule by mouth daily.     aspirin 81 MG chewable tablet Chew 81 mg by mouth daily.     atorvastatin (LIPITOR) 20 MG tablet Take 1 tablet (20 mg total) by mouth daily. 90 tablet 1   metoprolol succinate (TOPROL-XL) 50 MG 24 hr tablet Take 1 tablet (50 mg total) by mouth daily. Take with or immediately following a meal. 90 tablet 1   nitroGLYCERIN  (NITROSTAT) 0.4 MG SL tablet Place 1 tablet (0.4 mg total) under the tongue every 5 (five) minutes as needed for chest pain. 12 tablet 0   olmesartan-hydrochlorothiazide (BENICAR HCT) 40-25 MG tablet Take 1 tablet by mouth every morning. 90 tablet 1  nicotine polacrilex (NICORETTE) 4 MG gum Take 1 each (4 mg total) by mouth as needed for smoking cessation. 100 tablet 0   No facility-administered medications prior to visit.     Medication list after today's encounter   Current Outpatient Medications  Medication Instructions   amLODipine (NORVASC) 10 MG tablet TAKE 1 TABLET BY MOUTH EVERY DAY IN THE EVENING   amphetamine-dextroamphetamine (ADDERALL XR) 30 MG 24 hr capsule 1 capsule, Oral, Daily   aspirin 81 mg, Oral, Daily   atorvastatin (LIPITOR) 20 mg, Oral, Daily   metoprolol succinate (TOPROL-XL) 50 mg, Oral, Daily, Take with or immediately following a meal.   nicotine polacrilex (NICORETTE) 4 mg, Oral, As needed   nitroGLYCERIN (NITROSTAT) 0.4 mg, Sublingual, Every 5 min PRN   olmesartan-hydrochlorothiazide (BENICAR HCT) 40-25 MG tablet 1 tablet, Oral, BH-each morning    Radiology:   No results found. .  Compared to 12/04/2020, mild cardiomegaly not present.  Cardiac Studies:   PCV ECHOCARDIOGRAM COMPLETE 123XX123 Normal LV systolic function with EF 57%. Left ventricle cavity is normal in size. Mild concentric remodeling of the left ventricle. Normal global wall motion. Normal diastolic filling pattern. Calculated EF 57%. Trileaflet aortic valve. No evidence of aortic stenosis. Mild (Grade I) aortic regurgitation. Mild aortic valve leaflet calcification mostly involving non coronary cusp. 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   PCV MYOCARDIAL PERFUSION WO LEXISCAN 03/09/2021 Exercise nuclear stress test was performed using Bruce protocol. Patient reached 10.1 METS, and 85% of age predicted maximum heart rate. Exercise capacity was good. No chest pain reported. Heart rate and  hemodynamic response were normal. Stress EKG revealed no ischemic changes. Normal myocardial perfusion. Stress LVEF 50%. Low risk study.  Coronary calcium score 03/08/2021: LM 0 LAD 39.5 LCx 0 RCA 3.4 Total Agatston score 42.9, MESA database percentile 83. Aneurysmal dilatation of the ascending aorta at 41 mm and descending aorta is normal at 27.  There is mild calcification at the level of the aortic valve.  Recommend correlation with echocardiography.  EKG:   02/17/2021: Normal sinus rhythm at rate of 63 bpm, normal axis, nonspecific ST depression in the lateral leads, LVH with repolarization abnormality.  No change from yesterday's EKG on 02/16/2021.    Assessment     ICD-10-CM   1. Primary hypertension  I10     2. Pure hypercholesterolemia  E78.00     3. Aneurysm of ascending aorta without rupture  I71.21 PCV ECHOCARDIOGRAM COMPLETE    4. Tobacco use disorder, severe, dependence  F17.200 nicotine polacrilex (NICORETTE) 4 MG gum       Medications Discontinued During This Encounter  Medication Reason   nicotine polacrilex (NICORETTE) 4 MG gum Reorder    Meds ordered this encounter  Medications   nicotine polacrilex (NICORETTE) 4 MG gum    Sig: Take 1 each (4 mg total) by mouth as needed for smoking cessation.    Dispense:  100 tablet    Refill:  0     Orders Placed This Encounter  Procedures   PCV ECHOCARDIOGRAM COMPLETE    Standing Status:   Future    Standing Expiration Date:   05/04/2022    Recommendations:   Marvin Martinez is a 51 y.o. Caucasian male patient with hypertension,  longstanding history of tobacco use disorder since age 8,  abnormal EKG with ST depression and LVH, who underwent echocardiogram and stress test.  Stress test was overall low risk and echocardiogram revealed preserved LVEF with aortic root dilation.  Patient presents for 6 week follow up of hypertension and hyperlipidemia. At last office visit switch patient from HCTZ to Benicar/HCTZ  and added Toprol-XL 50 mg daily.  Repeat BMP remained stable.Lipid profile testing shows lipids are now well controlled (LDL <55).  Patient is tolerating medication changes without issue.  Blood pressure is now well controlled.  Will not make changes to medications at this time.  Chest pain has essentially resolved, and there is no clinical evidence of heart failure.   Patient is congratulated on his efforts to quit smoking and encouraged to stick with a goal of complete cessation in the end of this month.  I have refilled Nicorette, at his request.  He does have aortic root dilatation by echocardiogram, which correlates well with CT scan of the chest. Will repeat echocardiogram for annual surveillance in November 2023.   Patient is otherwise stable from a cardiovascular standpoint Follow up in 1 year.    Alethia Berthold, PA-C 05/04/2021, 8:55 AM Office: [phone]

## 2021-05-04 ENCOUNTER — Other Ambulatory Visit: Payer: Self-pay

## 2021-05-04 ENCOUNTER — Encounter: Payer: Self-pay | Admitting: Student

## 2021-05-04 ENCOUNTER — Ambulatory Visit: Payer: BC Managed Care – PPO | Admitting: Student

## 2021-05-04 VITALS — BP 121/82 | HR 69 | Temp 98.6°F | Ht 68.0 in | Wt 176.8 lb

## 2021-05-04 DIAGNOSIS — F17200 Nicotine dependence, unspecified, uncomplicated: Secondary | ICD-10-CM | POA: Diagnosis not present

## 2021-05-04 DIAGNOSIS — I10 Essential (primary) hypertension: Secondary | ICD-10-CM | POA: Diagnosis not present

## 2021-05-04 DIAGNOSIS — I7121 Aneurysm of the ascending aorta, without rupture: Secondary | ICD-10-CM

## 2021-05-04 DIAGNOSIS — E7800 Pure hypercholesterolemia, unspecified: Secondary | ICD-10-CM

## 2021-05-04 MED ORDER — NICOTINE POLACRILEX 4 MG MT GUM: 4.0000 mg | CHEWING_GUM | OROMUCOSAL | 0 refills | Status: DC | PRN

## 2021-05-15 ENCOUNTER — Other Ambulatory Visit: Payer: Self-pay | Admitting: Cardiology

## 2021-05-15 ENCOUNTER — Other Ambulatory Visit: Payer: Self-pay | Admitting: Student

## 2021-05-15 DIAGNOSIS — F17200 Nicotine dependence, unspecified, uncomplicated: Secondary | ICD-10-CM

## 2021-05-15 DIAGNOSIS — R072 Precordial pain: Secondary | ICD-10-CM

## 2021-06-04 ENCOUNTER — Other Ambulatory Visit: Payer: Self-pay | Admitting: Cardiology

## 2021-06-04 DIAGNOSIS — R072 Precordial pain: Secondary | ICD-10-CM

## 2021-06-04 DIAGNOSIS — I10 Essential (primary) hypertension: Secondary | ICD-10-CM

## 2021-08-04 DIAGNOSIS — F900 Attention-deficit hyperactivity disorder, predominantly inattentive type: Secondary | ICD-10-CM | POA: Diagnosis not present

## 2021-08-04 DIAGNOSIS — Z0000 Encounter for general adult medical examination without abnormal findings: Secondary | ICD-10-CM | POA: Diagnosis not present

## 2021-08-04 DIAGNOSIS — I10 Essential (primary) hypertension: Secondary | ICD-10-CM | POA: Diagnosis not present

## 2021-08-08 DIAGNOSIS — E871 Hypo-osmolality and hyponatremia: Secondary | ICD-10-CM | POA: Diagnosis not present

## 2021-09-07 ENCOUNTER — Other Ambulatory Visit: Payer: Self-pay | Admitting: Cardiology

## 2021-09-07 DIAGNOSIS — R072 Precordial pain: Secondary | ICD-10-CM

## 2021-09-07 DIAGNOSIS — I10 Essential (primary) hypertension: Secondary | ICD-10-CM

## 2021-09-14 DIAGNOSIS — E871 Hypo-osmolality and hyponatremia: Secondary | ICD-10-CM | POA: Diagnosis not present

## 2021-09-14 DIAGNOSIS — I10 Essential (primary) hypertension: Secondary | ICD-10-CM | POA: Diagnosis not present

## 2021-09-20 ENCOUNTER — Other Ambulatory Visit: Payer: Self-pay | Admitting: Cardiology

## 2021-09-20 DIAGNOSIS — I10 Essential (primary) hypertension: Secondary | ICD-10-CM

## 2021-09-20 DIAGNOSIS — R931 Abnormal findings on diagnostic imaging of heart and coronary circulation: Secondary | ICD-10-CM

## 2021-09-20 DIAGNOSIS — E7800 Pure hypercholesterolemia, unspecified: Secondary | ICD-10-CM

## 2021-11-01 DIAGNOSIS — Z720 Tobacco use: Secondary | ICD-10-CM | POA: Diagnosis not present

## 2021-11-01 DIAGNOSIS — E871 Hypo-osmolality and hyponatremia: Secondary | ICD-10-CM | POA: Diagnosis not present

## 2021-11-01 DIAGNOSIS — F909 Attention-deficit hyperactivity disorder, unspecified type: Secondary | ICD-10-CM | POA: Diagnosis not present

## 2021-11-01 DIAGNOSIS — J440 Chronic obstructive pulmonary disease with acute lower respiratory infection: Secondary | ICD-10-CM | POA: Diagnosis not present

## 2021-12-01 DIAGNOSIS — E871 Hypo-osmolality and hyponatremia: Secondary | ICD-10-CM | POA: Diagnosis not present

## 2021-12-08 DIAGNOSIS — J440 Chronic obstructive pulmonary disease with acute lower respiratory infection: Secondary | ICD-10-CM | POA: Diagnosis not present

## 2021-12-08 DIAGNOSIS — Z720 Tobacco use: Secondary | ICD-10-CM | POA: Diagnosis not present

## 2021-12-08 DIAGNOSIS — E871 Hypo-osmolality and hyponatremia: Secondary | ICD-10-CM | POA: Diagnosis not present

## 2021-12-08 DIAGNOSIS — F909 Attention-deficit hyperactivity disorder, unspecified type: Secondary | ICD-10-CM | POA: Diagnosis not present

## 2021-12-11 ENCOUNTER — Other Ambulatory Visit: Payer: Self-pay | Admitting: Cardiology

## 2021-12-11 DIAGNOSIS — I10 Essential (primary) hypertension: Secondary | ICD-10-CM

## 2021-12-11 DIAGNOSIS — R072 Precordial pain: Secondary | ICD-10-CM

## 2022-03-01 ENCOUNTER — Ambulatory Visit: Payer: BC Managed Care – PPO

## 2022-03-01 DIAGNOSIS — I7121 Aneurysm of the ascending aorta, without rupture: Secondary | ICD-10-CM | POA: Diagnosis not present

## 2022-03-10 IMAGING — CT CT CARDIAC CORONARY ARTERY CALCIUM SCORE
3 series · 13 of 20 positions shown, 15 images · non-contrast
Comparison: None.

CLINICAL DATA: 50-year-old Caucasian male with history of
hypertension.

EXAM:
CT CARDIAC CORONARY ARTERY CALCIUM SCORE
TECHNIQUE: Non-contrast imaging through the heart was performed using
prospective ECG gating. Image post processing was performed on an
independent workstation, allowing for quantitative analysis of the
heart and coronary arteries. Note that this exam targets the heart
and the chest was not imaged in its entirety.

[Series 2: calcium scoring 2.00 qr36 bestdiast 70% hrt calciu · axial · 0.43mm/px · z∈[+1543,+1607]mm · 3 of 80 slices shown]
[im 16/80  vessel]
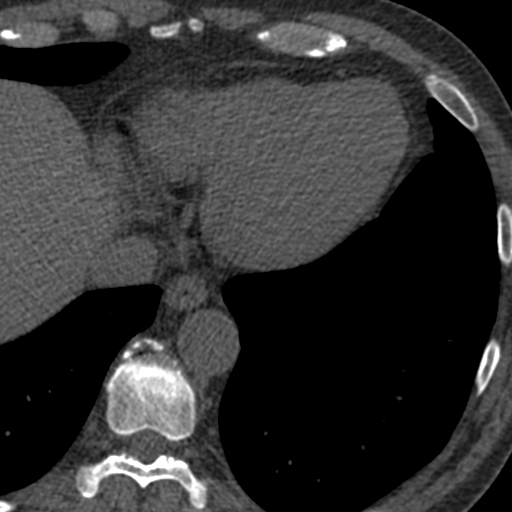
[im 32/80  vessel]
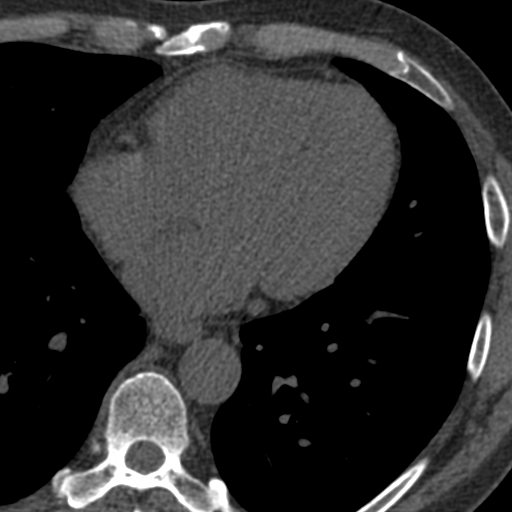
[im 48/80  vessel]
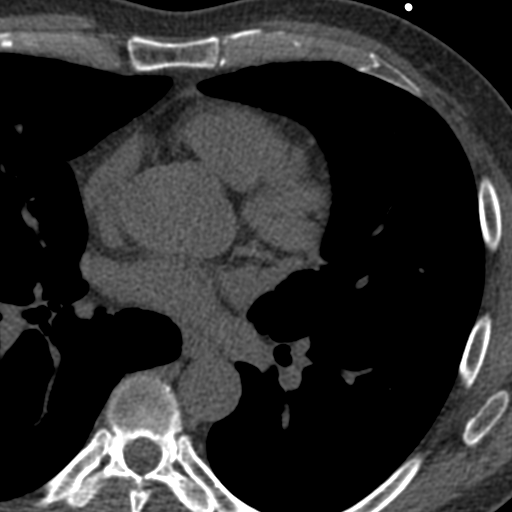

[Series 3: calcium scoring 2.00 br40 bestdiast 70% axial · axial · 0.55mm/px · z∈[+1539,+1643]mm · 5 of 80 slices shown, 7 images]
[im 14/80  vessel]
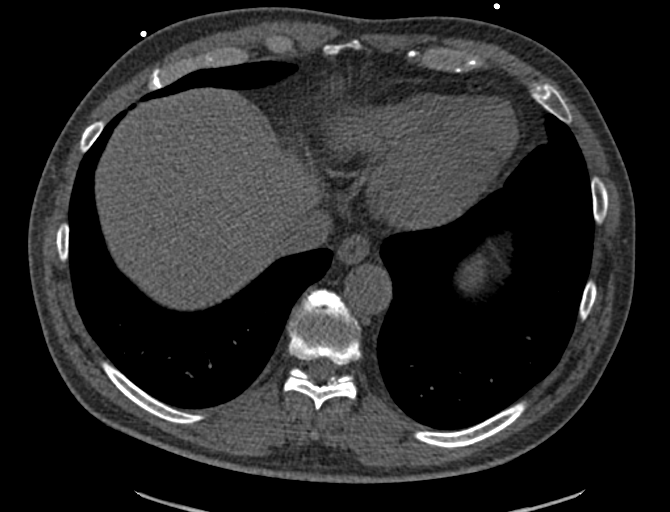
[im 14/80  lung]
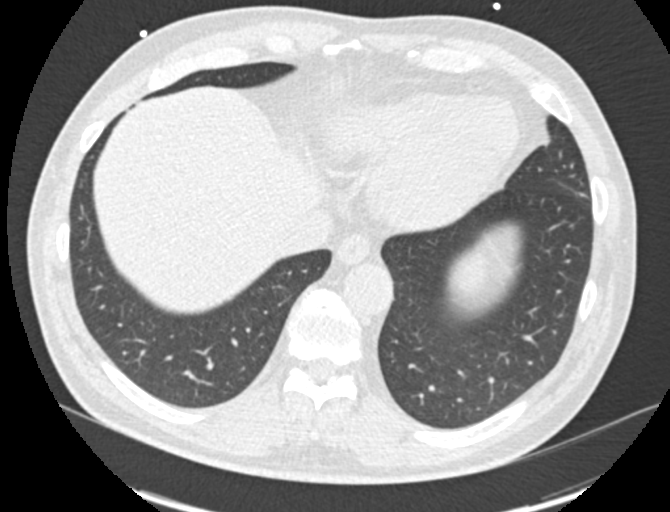
[im 27/80  vessel]
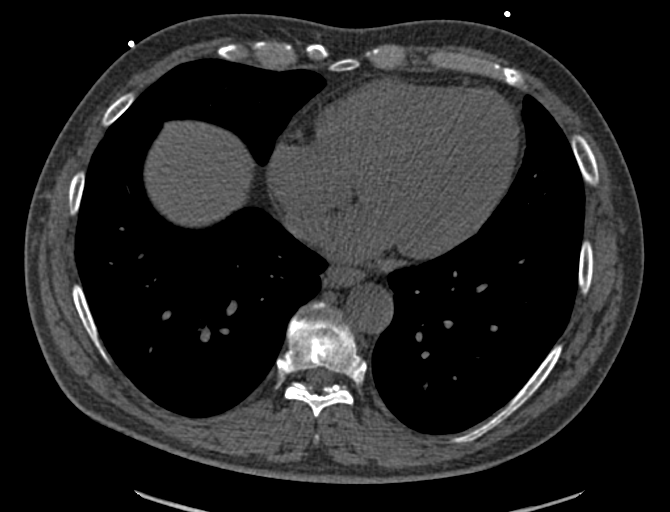
[im 40/80  vessel]
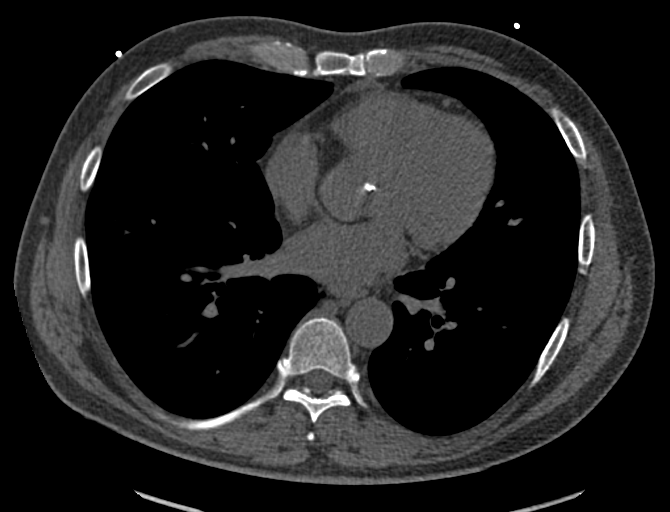
[im 53/80  vessel]
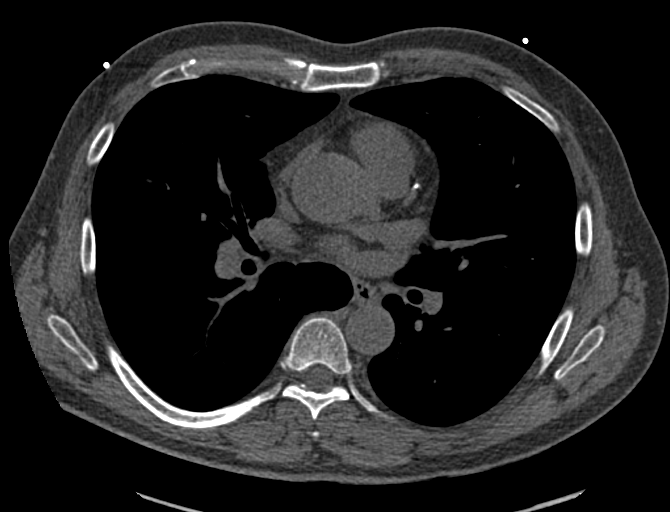
[im 66/80  vessel]
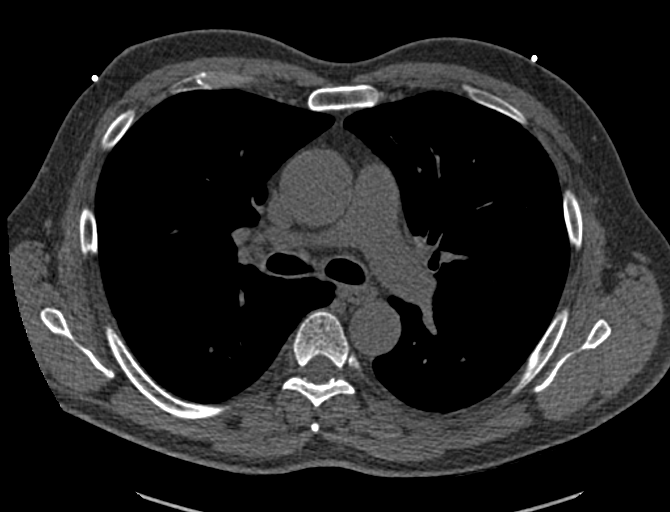
[im 66/80  lung]
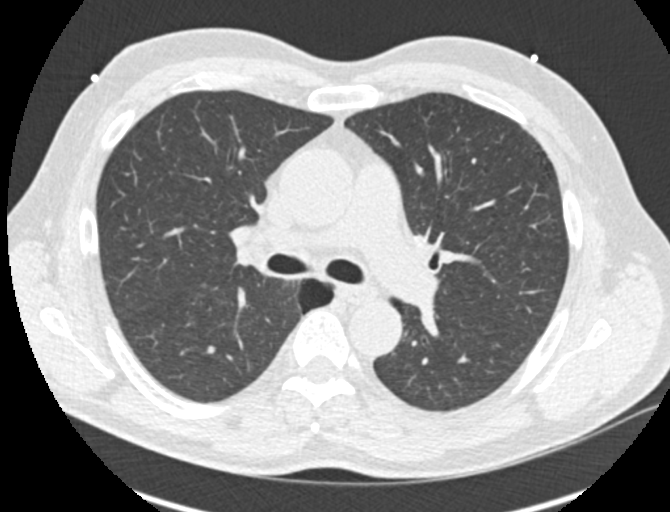

[Series 9: calcium scoring 2.00 br60 bestdiast 70% lungs · axial · 0.54mm/px · z∈[+1539,+1643]mm · 5 of 80 slices shown]
[im 14/80  vessel]
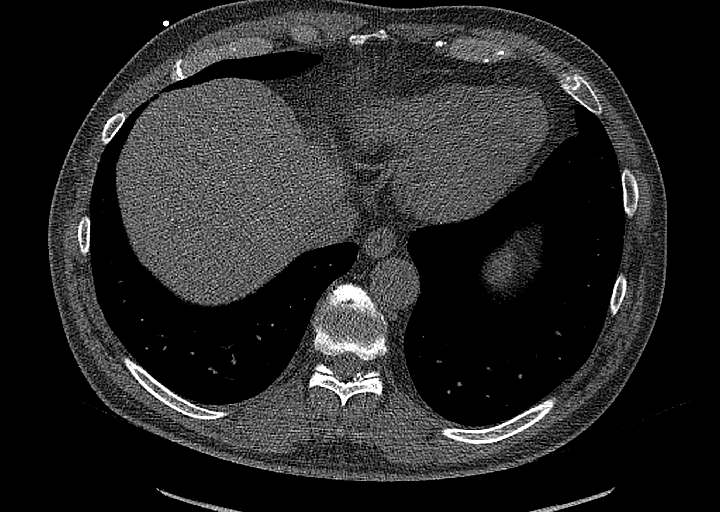
[im 27/80  vessel]
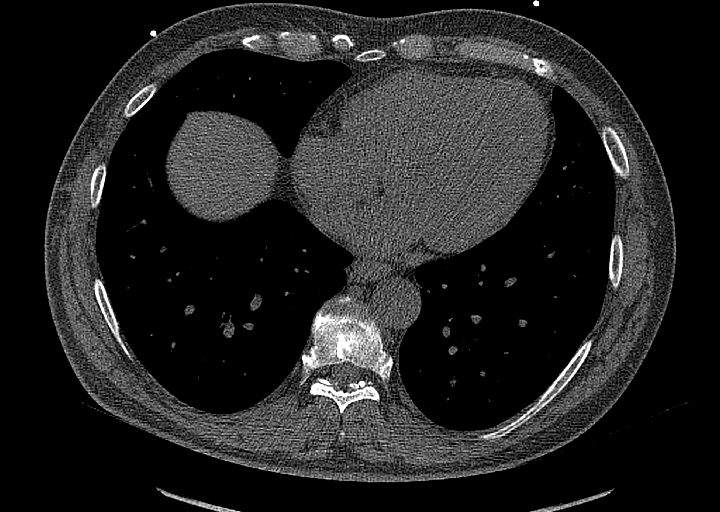
[im 40/80  vessel]
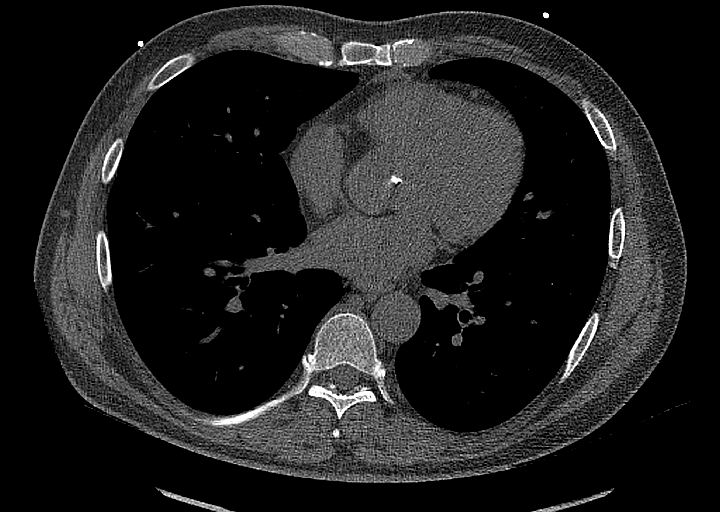
[im 53/80  vessel]
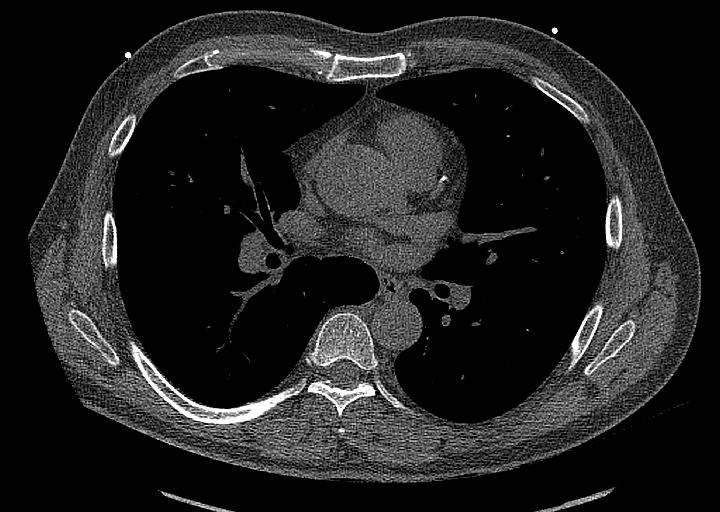
[im 66/80  vessel]
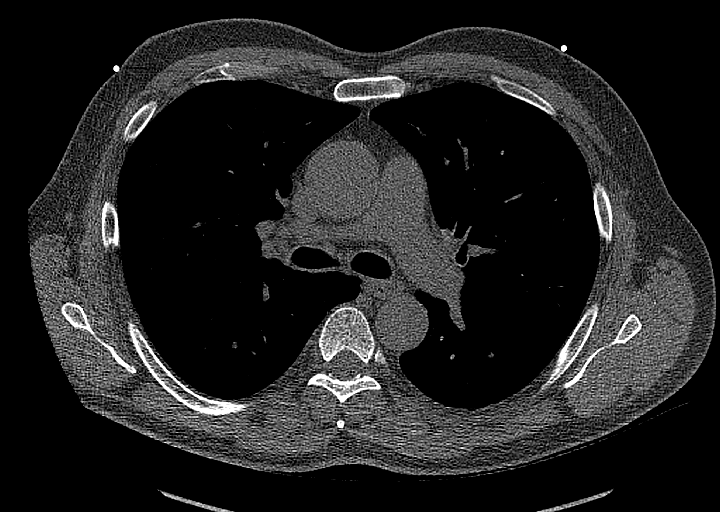

[13 of 20 positions shown; findings below may reference images not displayed]

FINDINGS: CORONARY CALCIUM SCORES:

Left Main: 0

LAD:

LCx: 0

RCA:

Total Agatston Score:

[HOSPITAL] percentile: 83

AORTA MEASUREMENTS:

Ascending Aorta: 41 mm

Descending Aorta: 27 mm

OTHER FINDINGS:

The heart size is within normal limits. No pericardial fluid is
identified. There is dilatation of the ascending thoracic aorta up
to approximately 4.1 cm with associated calcification at the level
of the aortic valve. Visualized central pulmonary arteries are
normal in caliber. Visualized mediastinum and hilar regions
demonstrate no lymphadenopathy or masses. Lungs demonstrate evidence
of emphysematous lung disease and some subpleural bullae. Visualized
lungs show no evidence of pulmonary edema, consolidation,
pneumothorax, nodule or pleural fluid. Visualized upper abdomen and
bony structures are unremarkable.
IMPRESSION: 1. Coronary calcium score of 42.9 is at the 83rd percentile for the
patient's age, sex and race.
2. Aneurysmal dilatation of the ascending thoracic aorta up to
approximately 4.1 cm with associated calcification at the level of
the aortic valve. Recommend correlation with echocardiography.
Recommend annual imaging followup by CTA or MRA. This recommendation
follows 4070 ACCF/AHA/AATS/ACR/ASA/SCA/MARIO-MIRKO/MEMMEDZADE/ARTOLA/PRINCEX Guidelines
for the Diagnosis and Management of Patients with Thoracic Aortic
Disease. Circulation. 4070; 121: E266-e369. Aortic aneurysm NOS
([id number])
3. Emphysematous lung disease.

## 2022-03-13 DIAGNOSIS — E871 Hypo-osmolality and hyponatremia: Secondary | ICD-10-CM | POA: Diagnosis not present

## 2022-04-18 DIAGNOSIS — E871 Hypo-osmolality and hyponatremia: Secondary | ICD-10-CM | POA: Diagnosis not present

## 2022-04-18 DIAGNOSIS — I10 Essential (primary) hypertension: Secondary | ICD-10-CM | POA: Diagnosis not present

## 2022-04-26 DIAGNOSIS — D125 Benign neoplasm of sigmoid colon: Secondary | ICD-10-CM | POA: Diagnosis not present

## 2022-04-26 DIAGNOSIS — D128 Benign neoplasm of rectum: Secondary | ICD-10-CM | POA: Diagnosis not present

## 2022-04-26 DIAGNOSIS — Z1211 Encounter for screening for malignant neoplasm of colon: Secondary | ICD-10-CM | POA: Diagnosis not present

## 2022-04-26 DIAGNOSIS — K5730 Diverticulosis of large intestine without perforation or abscess without bleeding: Secondary | ICD-10-CM | POA: Diagnosis not present

## 2022-04-26 DIAGNOSIS — D122 Benign neoplasm of ascending colon: Secondary | ICD-10-CM | POA: Diagnosis not present

## 2022-05-04 ENCOUNTER — Ambulatory Visit: Payer: BC Managed Care – PPO | Admitting: Student

## 2022-05-12 DIAGNOSIS — I10 Essential (primary) hypertension: Secondary | ICD-10-CM | POA: Diagnosis not present

## 2022-05-12 DIAGNOSIS — E782 Mixed hyperlipidemia: Secondary | ICD-10-CM | POA: Diagnosis not present

## 2022-05-12 DIAGNOSIS — J439 Emphysema, unspecified: Secondary | ICD-10-CM | POA: Diagnosis not present

## 2022-05-12 DIAGNOSIS — F900 Attention-deficit hyperactivity disorder, predominantly inattentive type: Secondary | ICD-10-CM | POA: Diagnosis not present

## 2022-05-12 DIAGNOSIS — F17210 Nicotine dependence, cigarettes, uncomplicated: Secondary | ICD-10-CM | POA: Diagnosis not present

## 2022-07-18 DIAGNOSIS — E871 Hypo-osmolality and hyponatremia: Secondary | ICD-10-CM | POA: Diagnosis not present

## 2022-11-07 DIAGNOSIS — E871 Hypo-osmolality and hyponatremia: Secondary | ICD-10-CM | POA: Diagnosis not present

## 2022-11-09 DIAGNOSIS — E871 Hypo-osmolality and hyponatremia: Secondary | ICD-10-CM | POA: Diagnosis not present

## 2022-11-09 DIAGNOSIS — I10 Essential (primary) hypertension: Secondary | ICD-10-CM | POA: Diagnosis not present

## 2022-11-21 DIAGNOSIS — F900 Attention-deficit hyperactivity disorder, predominantly inattentive type: Secondary | ICD-10-CM | POA: Diagnosis not present

## 2022-11-21 DIAGNOSIS — I719 Aortic aneurysm of unspecified site, without rupture: Secondary | ICD-10-CM | POA: Diagnosis not present

## 2022-11-21 DIAGNOSIS — E782 Mixed hyperlipidemia: Secondary | ICD-10-CM | POA: Diagnosis not present

## 2022-11-21 DIAGNOSIS — I10 Essential (primary) hypertension: Secondary | ICD-10-CM | POA: Diagnosis not present

## 2022-11-21 DIAGNOSIS — Z0000 Encounter for general adult medical examination without abnormal findings: Secondary | ICD-10-CM | POA: Diagnosis not present

## 2023-02-23 DIAGNOSIS — F900 Attention-deficit hyperactivity disorder, predominantly inattentive type: Secondary | ICD-10-CM | POA: Diagnosis not present

## 2023-02-23 DIAGNOSIS — J439 Emphysema, unspecified: Secondary | ICD-10-CM | POA: Diagnosis not present

## 2023-02-23 DIAGNOSIS — I10 Essential (primary) hypertension: Secondary | ICD-10-CM | POA: Diagnosis not present

## 2023-02-23 DIAGNOSIS — E871 Hypo-osmolality and hyponatremia: Secondary | ICD-10-CM | POA: Diagnosis not present

## 2023-05-01 DIAGNOSIS — S0181XA Laceration without foreign body of other part of head, initial encounter: Secondary | ICD-10-CM | POA: Diagnosis not present

## 2023-08-08 DIAGNOSIS — I7121 Aneurysm of the ascending aorta, without rupture: Secondary | ICD-10-CM | POA: Diagnosis not present

## 2023-08-08 DIAGNOSIS — I10 Essential (primary) hypertension: Secondary | ICD-10-CM | POA: Diagnosis not present

## 2023-09-19 DIAGNOSIS — R809 Proteinuria, unspecified: Secondary | ICD-10-CM | POA: Diagnosis not present

## 2023-09-19 DIAGNOSIS — F17210 Nicotine dependence, cigarettes, uncomplicated: Secondary | ICD-10-CM | POA: Diagnosis not present

## 2023-09-19 DIAGNOSIS — I7121 Aneurysm of the ascending aorta, without rupture: Secondary | ICD-10-CM | POA: Diagnosis not present

## 2023-09-19 DIAGNOSIS — I10 Essential (primary) hypertension: Secondary | ICD-10-CM | POA: Diagnosis not present

## 2023-09-19 DIAGNOSIS — F900 Attention-deficit hyperactivity disorder, predominantly inattentive type: Secondary | ICD-10-CM | POA: Diagnosis not present

## 2023-12-03 ENCOUNTER — Encounter: Payer: Self-pay | Admitting: Cardiology

## 2023-12-03 ENCOUNTER — Ambulatory Visit: Attending: Cardiology | Admitting: Cardiology

## 2023-12-03 ENCOUNTER — Other Ambulatory Visit (HOSPITAL_COMMUNITY): Payer: Self-pay

## 2023-12-03 VITALS — BP 130/87 | HR 81 | Resp 16 | Ht 68.0 in

## 2023-12-03 DIAGNOSIS — I77810 Thoracic aortic ectasia: Secondary | ICD-10-CM

## 2023-12-03 DIAGNOSIS — I10 Essential (primary) hypertension: Secondary | ICD-10-CM | POA: Diagnosis not present

## 2023-12-03 DIAGNOSIS — R931 Abnormal findings on diagnostic imaging of heart and coronary circulation: Secondary | ICD-10-CM | POA: Diagnosis not present

## 2023-12-03 MED ORDER — NEBIVOLOL HCL 10 MG PO TABS
10.0000 mg | ORAL_TABLET | Freq: Every day | ORAL | 2 refills | Status: DC
  Filled 2023-12-03: qty 30, 30d supply, fill #0
  Filled 2024-01-07: qty 30, 30d supply, fill #1
  Filled 2024-02-08 – 2024-02-11 (×2): qty 30, 30d supply, fill #2

## 2023-12-03 NOTE — Patient Instructions (Signed)
 Medication Instructions:  Your physician has recommended you make the following change in your medication: Start bystolic  10 mg by mouth daily   *If you need a refill on your cardiac medications before your next appointment, please call your pharmacy*  Lab Work: none If you have labs (blood work) drawn today and your tests are completely normal, you will receive your results only by: MyChart Message (if you have MyChart) OR A paper copy in the mail If you have any lab test that is abnormal or we need to change your treatment, we will call you to review the results.  Testing/Procedures: none  Follow-Up: At [facility], you and your health needs are our priority.  As part of our continuing mission to provide you with exceptional heart care, our providers are all part of one team.  This team includes your primary Cardiologist (physician) and Advanced Practice Providers or APPs (Physician Assistants and Nurse Practitioners) who all work together to provide you with the care you need, when you need it.  Your next appointment:   As needed  Provider:   Gordy Bergamo, MD    We recommend signing up for the patient portal called MyChart.  Sign up information is provided on this After Visit Summary.  MyChart is used to connect with patients for Virtual Visits (Telemedicine).  Patients are able to view lab/test results, encounter notes, upcoming appointments, etc.  Non-urgent messages can be sent to your provider as well.   To learn more about what you can do with MyChart, go to [email or web address].   Other Instructions

## 2023-12-03 NOTE — Progress Notes (Signed)
 Cardiology Office Note:  .   Date:  12/03/2023  ID:  Marvin Martinez, DOB [birth year], MRN [id number] PCP: Rolinda Millman, MD  [facility] Providers Cardiologist:  Gordy Bergamo, MD   History of Present Illness: .   Marvin Martinez is a 53 y.o. Caucasian male patient with hypertension, longstanding history of tobacco use disorder since age 52, abnormal EKG with ST depression and LVH, who underwent echocardiogram and stress test. Stress test was overall low risk and echocardiogram revealed preserved LVEF with aortic root dilation.  Coronary calcium  score on 03/08/2021 was 42.9 in the [employer].  Cardiac Studies relevent.    MYOCARDIAL PERFUSION 03/09/2021 Exercise nuclear stress test was performed using Bruce protocol. Patient reached 10.1 METS, and 85% of age predicted maximum heart rate. Exercise capacity was good. No chest pain reported. Heart rate and hemodynamic response were normal. Stress EKG revealed no ischemic changes. Normal myocardial perfusion. Stress LVEF 50%. Low risk study.   ECHOCARDIOGRAM COMPLETE 02/28/2021 Normal LV systolic function with EF 57%. Left ventricle cavity is normal in size. Mild concentric remodeling of the left ventricle. Normal global wall motion. Normal diastolic filling pattern. Calculated EF 57%. Trileaflet aortic valve. No evidence of aortic stenosis. Mild (Grade I) aortic regurgitation. Mild aortic valve leaflet calcification mostly involving non coronary cusp. 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  Coronary calcium  score 03/08/2021: LM 0 LAD 39.5 LCx 0 RCA 3.4 Total Agatston score 42.9, MESA database percentile 83. Aneurysmal dilatation of the ascending aorta at 41 mm and descending aorta is normal at 27.  There is mild calcification at the level of the aortic valve.  Recommend correlation with echocardiography.    Discussed the use of AI scribe software for clinical note transcription with the patient, who gave verbal consent to  proceed.  History of Present Illness Marvin Martinez is a 53 year old male with hypertension who presents with left foot swelling and back pain.  He experiences swelling in the left foot and ankle, more noticeable when wearing flip flops and more apparent this year. There is a mild discrepancy in leg size. He takes amlodipine  and atorvastatin  for hypertension and hyperlipidemia. Blood pressure readings at home average 130/87, with occasional fluctuations, previously reaching 145/90. Hydralazine is used as needed for blood pressure control. No chest pain, shortness of breath, or palpitations.   Labs   Care everywhere/Faxed External Labs:  Labs 08/08/2023:  Total cholesterol 154, triglycerides 31, HDL 90, LDL 56.  Serum creatinine 0.670.  Potassium 4.6.  Labs 12/01/2021:  TSH normal at 0.905.  ROS  Review of Systems  Cardiovascular:  Negative for chest pain, dyspnea on exertion and leg swelling.   Physical Exam:   VS:  BP 130/87 (BP Location: Left Arm, Patient Position: Sitting, Cuff Size: Normal)   Pulse 81   Resp 16   Ht 5' 8 (1.727 m)   SpO2 97%   BMI 26.88 kg/m    Wt Readings from Last 3 Encounters:  05/04/21 176 lb 12.8 oz (80.2 kg)  03/23/21 175 lb 9.6 oz (79.7 kg)  02/17/21 173 lb 12.8 oz (78.8 kg)    BP Readings from Last 3 Encounters:  12/03/23 130/87  05/04/21 121/82  03/23/21 (!) 167/98   Physical Exam Neck:     Vascular: No carotid bruit or JVD.  Cardiovascular:     Rate and Rhythm: Normal rate and regular rhythm.     Pulses: Intact distal pulses.     Heart sounds:  Normal heart sounds. No murmur heard.    No gallop.  Pulmonary:     Effort: Pulmonary effort is normal.     Breath sounds: Normal breath sounds.  Abdominal:     General: Bowel sounds are normal.     Palpations: Abdomen is soft.  Musculoskeletal:     Right lower leg: No edema.     Left lower leg: No edema.    EKG:    EKG Interpretation Date/Time:  Monday December 03 2023 15:20:32  EDT Ventricular Rate:  82 PR Interval:  188 QRS Duration:  114 QT Interval:  402 QTC Calculation: 469 R Axis:   22  Text Interpretation: EKG 12/03/2023: Normal sinus rhythm at rate of 82 bpm, normal axis.  Minimal voltage criteria for LVH.  Compared to 02/16/2021, LVH with strain pattern with ST-T changes in the inferolateral leads no longer present. Confirmed by Charls Custer, Jagadeesh ([id number]) on 12/03/2023 4:00:21 PM    ASSESSMENT AND PLAN: .      ICD-10-CM   1. Primary hypertension  I10 EKG 12-Lead    nebivolol  (BYSTOLIC ) 10 MG tablet    2. Aortic root dilatation (HCC)  I77.810     3. Elevated coronary artery calcium  score  R93.1       Assessment and Plan Assessment & Plan Essential hypertension Blood pressure is well-controlled with current medication regimen. Home readings are stable, with occasional fluctuations not reaching previous high levels. - Prescribe omisartan HCT 40/25 mg once daily. - Continue amlodipine  5-10 mg once daily. - Prescribe nebivolol  10 mg once daily. - Use hydralazine as needed if blood pressure exceeds 150/90 mmHg. - Monitor blood pressure to ensure diastolic pressure remains below 80 mmHg. - Adjust nebivolol  dose to 20 mg if needed for better control without side effects.  Hyperlipidemia Cholesterol levels are well-controlled. LDL is 56 mg/dL and HDL is 90 mg/dL.  Musculoskeletal back pain Back pain is muscular in origin, not related to cardiac issues. Examination and EKG are normal.  Left foot and ankle swelling Mild swelling in the left foot and ankle, possibly related to mild arthritis. Circulation and pulse are normal.  Aortic root dilatation (stable, not clinically significant) Aortic root dilatation is not clinically significant. No associated risk factors such as hypertension or family history of aneurysm.   Follow up: PRN  Signed,  Gordy Bergamo, MD, [facility] 12/03/2023, 9:17 PM [facility] [address] [place], [place]  [address] Phone: [phone]. Fax:  [phone]

## 2024-01-07 ENCOUNTER — Other Ambulatory Visit (HOSPITAL_COMMUNITY): Payer: Self-pay

## 2024-02-08 ENCOUNTER — Other Ambulatory Visit (HOSPITAL_COMMUNITY): Payer: Self-pay

## 2024-02-11 ENCOUNTER — Other Ambulatory Visit (HOSPITAL_COMMUNITY): Payer: Self-pay

## 2024-03-12 DIAGNOSIS — E782 Mixed hyperlipidemia: Secondary | ICD-10-CM | POA: Diagnosis not present

## 2024-03-12 DIAGNOSIS — F900 Attention-deficit hyperactivity disorder, predominantly inattentive type: Secondary | ICD-10-CM | POA: Diagnosis not present

## 2024-03-12 DIAGNOSIS — F17210 Nicotine dependence, cigarettes, uncomplicated: Secondary | ICD-10-CM | POA: Diagnosis not present

## 2024-03-12 DIAGNOSIS — Z125 Encounter for screening for malignant neoplasm of prostate: Secondary | ICD-10-CM | POA: Diagnosis not present

## 2024-03-12 DIAGNOSIS — Z0000 Encounter for general adult medical examination without abnormal findings: Secondary | ICD-10-CM | POA: Diagnosis not present

## 2024-03-12 DIAGNOSIS — I10 Essential (primary) hypertension: Secondary | ICD-10-CM | POA: Diagnosis not present

## 2024-03-18 ENCOUNTER — Other Ambulatory Visit: Payer: Self-pay | Admitting: Cardiology

## 2024-03-18 DIAGNOSIS — I10 Essential (primary) hypertension: Secondary | ICD-10-CM

## 2024-03-24 ENCOUNTER — Other Ambulatory Visit (HOSPITAL_COMMUNITY): Payer: Self-pay

## 2024-03-24 MED ORDER — NEBIVOLOL HCL 10 MG PO TABS
10.0000 mg | ORAL_TABLET | Freq: Every day | ORAL | 2 refills | Status: AC
  Filled 2024-03-24: qty 90, 90d supply, fill #0

## 2024-05-28 ENCOUNTER — Institutional Professional Consult (permissible substitution) (INDEPENDENT_AMBULATORY_CARE_PROVIDER_SITE_OTHER): Admitting: Physician Assistant
